# Patient Record
Sex: Female | Born: 1975 | Hispanic: No | Marital: Married | Smoking: Former smoker
Health system: Southern US, Community
[De-identification: ages and names within clinical notes are randomized; demographics above are authoritative.]

## PROBLEM LIST (undated history)

## (undated) DIAGNOSIS — F329 Major depressive disorder, single episode, unspecified: Secondary | ICD-10-CM

## (undated) DIAGNOSIS — N200 Calculus of kidney: Secondary | ICD-10-CM

## (undated) DIAGNOSIS — N83209 Unspecified ovarian cyst, unspecified side: Secondary | ICD-10-CM

## (undated) DIAGNOSIS — Z8489 Family history of other specified conditions: Secondary | ICD-10-CM

## (undated) DIAGNOSIS — N920 Excessive and frequent menstruation with regular cycle: Secondary | ICD-10-CM

## (undated) DIAGNOSIS — R5383 Other fatigue: Secondary | ICD-10-CM

## (undated) DIAGNOSIS — K649 Unspecified hemorrhoids: Secondary | ICD-10-CM

## (undated) DIAGNOSIS — F32A Depression, unspecified: Secondary | ICD-10-CM

## (undated) DIAGNOSIS — K589 Irritable bowel syndrome without diarrhea: Secondary | ICD-10-CM

## (undated) DIAGNOSIS — F419 Anxiety disorder, unspecified: Secondary | ICD-10-CM

## (undated) DIAGNOSIS — Z87442 Personal history of urinary calculi: Secondary | ICD-10-CM

## (undated) HISTORY — DX: Unspecified hemorrhoids: K64.9

## (undated) HISTORY — DX: Other fatigue: R53.83

## (undated) HISTORY — DX: Irritable bowel syndrome, unspecified: K58.9

## (undated) HISTORY — PX: RHINOPLASTY: SUR1284

## (undated) HISTORY — DX: Excessive and frequent menstruation with regular cycle: N92.0

## (undated) HISTORY — DX: Unspecified ovarian cyst, unspecified side: N83.209

## (undated) HISTORY — DX: Anxiety disorder, unspecified: F41.9

## (undated) HISTORY — PX: TUBAL LIGATION: SHX77

## (undated) HISTORY — PX: TONSILLECTOMY: SUR1361

## (undated) HISTORY — DX: Depression, unspecified: F32.A

## (undated) HISTORY — PX: ABDOMINAL HYSTERECTOMY: SHX81

## (undated) HISTORY — DX: Major depressive disorder, single episode, unspecified: F32.9

---
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
## 2007-06-11 ENCOUNTER — Emergency Department: Payer: Self-pay | Admitting: Emergency Medicine

## 2007-06-11 ENCOUNTER — Other Ambulatory Visit: Payer: Self-pay

## 2008-04-26 ENCOUNTER — Ambulatory Visit: Payer: Self-pay | Admitting: Gastroenterology

## 2008-06-09 ENCOUNTER — Emergency Department: Payer: Self-pay | Admitting: Emergency Medicine

## 2009-10-23 ENCOUNTER — Ambulatory Visit: Payer: Self-pay | Admitting: Gastroenterology

## 2010-08-28 ENCOUNTER — Emergency Department: Payer: Self-pay | Admitting: Emergency Medicine

## 2013-02-24 ENCOUNTER — Emergency Department: Payer: Self-pay | Admitting: Emergency Medicine

## 2013-05-05 ENCOUNTER — Ambulatory Visit: Payer: Self-pay | Admitting: Urology

## 2013-12-30 ENCOUNTER — Emergency Department: Payer: Self-pay | Admitting: Emergency Medicine

## 2013-12-30 LAB — PREGNANCY, URINE: PREGNANCY TEST, URINE: NEGATIVE m[IU]/mL

## 2015-03-06 LAB — LIPID PANEL
Cholesterol: 119 mg/dL (ref 0–200)
HDL: 40 mg/dL (ref 35–70)
LDL CALC: 69 mg/dL
Triglycerides: 50 mg/dL (ref 40–160)

## 2015-03-06 LAB — HM PAP SMEAR: HM Pap smear: NEGATIVE

## 2015-03-06 LAB — HEMOGLOBIN A1C: Hgb A1c MFr Bld: 5.4 % (ref 4.0–6.0)

## 2015-04-23 ENCOUNTER — Encounter: Payer: Self-pay | Admitting: *Deleted

## 2015-04-23 ENCOUNTER — Ambulatory Visit (INDEPENDENT_AMBULATORY_CARE_PROVIDER_SITE_OTHER): Payer: Medicaid Other | Admitting: *Deleted

## 2015-04-23 VITALS — BP 115/79 | HR 70 | Wt 217.2 lb

## 2015-04-23 DIAGNOSIS — E669 Obesity, unspecified: Secondary | ICD-10-CM | POA: Diagnosis not present

## 2015-04-23 MED ORDER — CYANOCOBALAMIN 1000 MCG/ML IJ SOLN
1000.0000 ug | Freq: Once | INTRAMUSCULAR | Status: AC
  Administered 2015-04-23: 1000 ug via INTRAMUSCULAR

## 2015-04-23 NOTE — Progress Notes (Signed)
Patient ID: Martha Freeman, female   DOB: 05/08/1976, 39 y.o.   MRN: [id number] PT IS HERE FOR HER MONTHLY B-12 INJ PT IS DOING WELL, DENIES ANY COMPLAINTS

## 2015-05-14 ENCOUNTER — Encounter: Payer: Self-pay | Admitting: *Deleted

## 2015-05-22 ENCOUNTER — Ambulatory Visit (INDEPENDENT_AMBULATORY_CARE_PROVIDER_SITE_OTHER): Payer: Medicaid Other | Admitting: *Deleted

## 2015-05-22 ENCOUNTER — Ambulatory Visit: Payer: Medicaid Other

## 2015-05-22 VITALS — BP 108/74 | HR 77 | Ht 66.6 in | Wt 221.0 lb

## 2015-05-22 DIAGNOSIS — G9331 Postviral fatigue syndrome: Secondary | ICD-10-CM

## 2015-05-22 DIAGNOSIS — G933 Postviral fatigue syndrome: Secondary | ICD-10-CM | POA: Diagnosis not present

## 2015-05-22 MED ORDER — CYANOCOBALAMIN 1000 MCG/ML IJ SOLN
1000.0000 ug | Freq: Once | INTRAMUSCULAR | Status: AC
  Administered 2015-05-22: 1000 ug via INTRAMUSCULAR

## 2015-05-22 NOTE — Progress Notes (Cosign Needed)
Pt is here for her monthly b-12 inj Denies any side effects

## 2015-09-28 ENCOUNTER — Ambulatory Visit: Payer: Medicaid Other

## 2015-09-28 ENCOUNTER — Ambulatory Visit (INDEPENDENT_AMBULATORY_CARE_PROVIDER_SITE_OTHER): Payer: Medicaid Other

## 2015-09-28 VITALS — BP 113/79 | HR 79 | Ht 66.6 in | Wt 225.2 lb

## 2015-09-28 DIAGNOSIS — G933 Postviral fatigue syndrome: Secondary | ICD-10-CM | POA: Diagnosis not present

## 2015-09-28 DIAGNOSIS — G9331 Postviral fatigue syndrome: Secondary | ICD-10-CM

## 2015-09-28 MED ORDER — CYANOCOBALAMIN 1000 MCG/ML IJ SOLN
1000.0000 ug | Freq: Once | INTRAMUSCULAR | Status: AC
  Administered 2015-09-28: 1000 ug via INTRAMUSCULAR

## 2015-09-28 NOTE — Progress Notes (Signed)
Patient ID: Martha Freeman, female   DOB: 1976-03-20, 39 y.o.   MRN: [id number] Pt presents for chronic fatigue syndrome and B-12 injection. Pt states her energy level down at present.  No other complaints.

## 2015-10-25 ENCOUNTER — Encounter: Payer: Self-pay | Admitting: Obstetrics and Gynecology

## 2015-10-25 ENCOUNTER — Ambulatory Visit (INDEPENDENT_AMBULATORY_CARE_PROVIDER_SITE_OTHER): Payer: Medicaid Other | Admitting: Obstetrics and Gynecology

## 2015-10-25 VITALS — BP 105/73 | HR 92 | Wt 225.9 lb

## 2015-10-25 DIAGNOSIS — E538 Deficiency of other specified B group vitamins: Secondary | ICD-10-CM | POA: Diagnosis not present

## 2015-10-25 DIAGNOSIS — E559 Vitamin D deficiency, unspecified: Secondary | ICD-10-CM

## 2015-10-25 DIAGNOSIS — Z8619 Personal history of other infectious and parasitic diseases: Secondary | ICD-10-CM

## 2015-10-25 DIAGNOSIS — E669 Obesity, unspecified: Secondary | ICD-10-CM

## 2015-10-25 MED ORDER — PHENTERMINE HCL 37.5 MG PO TABS
37.5000 mg | ORAL_TABLET | Freq: Every day | ORAL | Status: DC
Start: 2015-10-25 — End: 2016-01-17

## 2015-10-25 NOTE — Progress Notes (Signed)
Subjective:     Patient ID: Martha Freeman, female   DOB: [birth year], 39 y.o.   MRN: [id number]  HPI H/o ovarian cyst, last seen earlier this year and started on OCP for suppression for a few months, no pain at this time, but menses still irregular. Stopped OCPs due to weight gain.  Also on monthly B12 injections.  Review of Systems Fatigue relieved with B12 injections. Weight gain    Objective:   Physical Exam A&O x4 Well groomed female in no distress Pelvic exam: normal external genitalia, vulva, vagina, cervix, uterus and adnexa.    Assessment:     B12 deficiency Obesity Fatigue  h/o recurrent shingles    Plan:     Repeat labs Recommended shingles vaccine- rx given   Mikhaila Roh,CNM

## 2015-10-26 ENCOUNTER — Telehealth: Payer: Self-pay | Admitting: *Deleted

## 2015-10-26 LAB — CBC
HEMATOCRIT: 42.1 % (ref 34.0–46.6)
HEMOGLOBIN: 13.9 g/dL (ref 11.1–15.9)
MCH: 28.8 pg (ref 26.6–33.0)
MCHC: 33 g/dL (ref 31.5–35.7)
MCV: 87 fL (ref 79–97)
Platelets: 357 10*3/uL (ref 150–379)
RBC: 4.83 x10E6/uL (ref 3.77–5.28)
RDW: 12.9 % (ref 12.3–15.4)
WBC: 10.4 10*3/uL (ref 3.4–10.8)

## 2015-10-26 LAB — VITAMIN B12: VITAMIN B 12: 483 pg/mL (ref 211–946)

## 2015-10-26 LAB — VITAMIN D 25 HYDROXY (VIT D DEFICIENCY, FRACTURES): Vit D, 25-Hydroxy: 37.6 ng/mL (ref 30.0–100.0)

## 2015-10-26 LAB — IRON: Iron: 60 ug/dL (ref 27–159)

## 2015-10-26 NOTE — Telephone Encounter (Signed)
Notified pt of results 

## 2015-10-26 NOTE — Telephone Encounter (Signed)
-----   Message from Joylene Igo, [place] sent at 10/26/2015 11:50 AM EST ----- Please let her know all labs were normal

## 2015-11-22 ENCOUNTER — Ambulatory Visit (INDEPENDENT_AMBULATORY_CARE_PROVIDER_SITE_OTHER): Payer: Medicaid Other | Admitting: Obstetrics and Gynecology

## 2015-11-22 VITALS — BP 110/73 | HR 98 | Ht 66.6 in | Wt 214.1 lb

## 2015-11-22 DIAGNOSIS — E669 Obesity, unspecified: Secondary | ICD-10-CM | POA: Diagnosis not present

## 2015-11-22 MED ORDER — CYANOCOBALAMIN 1000 MCG/ML IJ SOLN
1000.0000 ug | Freq: Once | INTRAMUSCULAR | Status: AC
  Administered 2015-11-22: 1000 ug via INTRAMUSCULAR

## 2015-11-22 NOTE — Progress Notes (Cosign Needed)
Patient ID: Martha Freeman, female   DOB: 1976-03-14, 40 y.o.   MRN: [id number] Pt presents for weight, B/P, B-12 injection. No side effects of medication-Phentermine, or B-12.  Weight loss of 11 lbs. Encouraged eating healthy and exercise.

## 2015-12-20 ENCOUNTER — Ambulatory Visit (INDEPENDENT_AMBULATORY_CARE_PROVIDER_SITE_OTHER): Payer: Medicaid Other | Admitting: Obstetrics and Gynecology

## 2015-12-20 VITALS — BP 118/79 | HR 82 | Wt 208.0 lb

## 2015-12-20 DIAGNOSIS — E669 Obesity, unspecified: Secondary | ICD-10-CM

## 2015-12-20 DIAGNOSIS — E538 Deficiency of other specified B group vitamins: Secondary | ICD-10-CM

## 2015-12-20 MED ORDER — CYANOCOBALAMIN 1000 MCG/ML IJ SOLN
1000.0000 ug | Freq: Once | INTRAMUSCULAR | Status: AC
  Administered 2015-12-20: 1000 ug via INTRAMUSCULAR

## 2015-12-20 NOTE — Progress Notes (Cosign Needed)
Patient ID: Martha Freeman, female   DOB: Feb 08, 1976, 40 y.o.   MRN: [id number] Pt presents for weight, B/P, B-12 injection. No side effects of medication-Phentermine, or B-12.  Weight loss of 6 lbs. Encouraged eating healthy and exercise.

## 2016-01-17 ENCOUNTER — Ambulatory Visit (INDEPENDENT_AMBULATORY_CARE_PROVIDER_SITE_OTHER): Payer: Medicaid Other | Admitting: Obstetrics and Gynecology

## 2016-01-17 ENCOUNTER — Encounter: Payer: Self-pay | Admitting: Obstetrics and Gynecology

## 2016-01-17 VITALS — BP 129/80 | HR 125 | Ht 67.0 in | Wt 207.6 lb

## 2016-01-17 DIAGNOSIS — E669 Obesity, unspecified: Secondary | ICD-10-CM | POA: Diagnosis not present

## 2016-01-17 MED ORDER — CYANOCOBALAMIN 1000 MCG/ML IJ SOLN: 1000.0000 ug | Freq: Once | INTRAMUSCULAR | Status: DC

## 2016-01-17 MED ORDER — PHENTERMINE HCL 37.5 MG PO TABS: 37.5000 mg | ORAL_TABLET | Freq: Every day | ORAL | Status: DC

## 2016-01-17 NOTE — Progress Notes (Signed)
SUBJECTIVE:  40 y.o. here for follow-up weight loss visit, previously seen 4 weeks ago. Denies any concerns and feels like medication has worked well, has 1 week left of pills, desires to continue to lose the rest of weight needed to get BMI <27. Has lost 18# to date.  OBJECTIVE:  BP 129/80 mmHg  Pulse 125  Ht 5\' 7"  (1.702 m)  Wt 207 lb 9.6 oz (94.167 kg)  BMI 32.51 kg/m2  LMP 12/23/2015 (Exact Date)  Body mass index is 32.51 kg/(m^2). Patient appears well. ASSESSMENT:  Obesity- responding well to weight loss plan PLAN:  To continue with current medications after taking 10 days off phentermine. Rx given. B12 1034mcg/ml injection given RTC in 5 weeks as planned  Melody [place], CNM

## 2016-02-21 ENCOUNTER — Ambulatory Visit: Payer: Medicaid Other

## 2016-02-27 ENCOUNTER — Ambulatory Visit (INDEPENDENT_AMBULATORY_CARE_PROVIDER_SITE_OTHER): Payer: Medicaid Other | Admitting: Obstetrics and Gynecology

## 2016-02-27 VITALS — BP 113/85 | HR 99 | Wt 194.1 lb

## 2016-02-27 DIAGNOSIS — E669 Obesity, unspecified: Secondary | ICD-10-CM | POA: Diagnosis not present

## 2016-02-27 MED ORDER — CYANOCOBALAMIN 1000 MCG/ML IJ SOLN
1000.0000 ug | Freq: Once | INTRAMUSCULAR | Status: AC
  Administered 2016-02-27: 1000 ug via INTRAMUSCULAR

## 2016-02-27 NOTE — Progress Notes (Signed)
Patient ID: Martha Freeman, female   DOB: 03-Mar-1976, 40 y.o.   MRN: [id number] Pt presents for weight, B/P, B-12 injection. No side effects of medication-Phentermine, or B-12.  Weight loss of  13 lbs. Encouraged eating healthy and exercise.

## 2016-03-26 ENCOUNTER — Ambulatory Visit (INDEPENDENT_AMBULATORY_CARE_PROVIDER_SITE_OTHER): Payer: Medicaid Other | Admitting: Obstetrics and Gynecology

## 2016-03-26 VITALS — BP 110/75 | HR 114 | Wt 191.5 lb

## 2016-03-26 DIAGNOSIS — E669 Obesity, unspecified: Secondary | ICD-10-CM

## 2016-03-26 MED ORDER — CYANOCOBALAMIN 1000 MCG/ML IJ SOLN
1000.0000 ug | Freq: Once | INTRAMUSCULAR | Status: AC
  Administered 2016-03-26: 1000 ug via INTRAMUSCULAR

## 2016-03-26 NOTE — Progress Notes (Signed)
Patient ID: Martha Freeman, female   DOB: 01/27/1976, 40 y.o.   MRN: [id number] Pt presents for weight, B/P, B-12 injection. No side effects of medication-Phentermine, or B-12.  Weight loss of 3 lbs. Encouraged eating healthy and exercise.

## 2016-04-23 ENCOUNTER — Ambulatory Visit (INDEPENDENT_AMBULATORY_CARE_PROVIDER_SITE_OTHER): Payer: Medicaid Other | Admitting: Obstetrics and Gynecology

## 2016-04-23 ENCOUNTER — Encounter: Payer: Self-pay | Admitting: Obstetrics and Gynecology

## 2016-04-23 VITALS — BP 121/89 | HR 96 | Wt 191.5 lb

## 2016-04-23 DIAGNOSIS — E669 Obesity, unspecified: Secondary | ICD-10-CM

## 2016-04-23 DIAGNOSIS — Z79899 Other long term (current) drug therapy: Secondary | ICD-10-CM

## 2016-04-23 DIAGNOSIS — M25562 Pain in left knee: Secondary | ICD-10-CM

## 2016-04-23 MED ORDER — CYANOCOBALAMIN 1000 MCG/ML IJ SOLN
1000.0000 ug | Freq: Once | INTRAMUSCULAR | Status: AC
  Administered 2016-04-23: 1000 ug via INTRAMUSCULAR

## 2016-04-23 MED ORDER — PHENTERMINE HCL 37.5 MG PO TABS: 37.5000 mg | ORAL_TABLET | Freq: Every day | ORAL | Status: DC

## 2016-04-23 MED ORDER — CYANOCOBALAMIN 1000 MCG/ML IJ SOLN: 1000.0000 ug | Freq: Once | INTRAMUSCULAR | Status: DC

## 2016-04-23 NOTE — Progress Notes (Signed)
SUBJECTIVE:  40 y.o. here for follow-up weight loss visit, previously seen 4 weeks ago. Denies any concerns and feels like medication has worked well, and has continued to lose weight.   OBJECTIVE:  BP 121/89 mmHg  Pulse 96  Wt 191 lb 8 oz (86.864 kg)  LMP 04/17/2016  Body mass index is 29.99 kg/(m^2). Patient appears well. A&O x4  well groomed female in no distress  Left Knee Exam  Swelling: Mild Effusion: Yes  Tenderness  None  Range of Motion  Normal left knee ROM Extension: Normal Flexion:     Normal  Tests  Varus:  Negative Valgus: Negative  Comments:  Crepitus noted with extension     ASSESSMENT:  Obesity- responding well to weight loss plan Left knee pain PLAN:  To continue with current medications. B12 1023mcg/ml injection given Referral to orthopedics for evaluation and treatment of left knee. RTC in 4 weeks as planned  Melody [place], CNM

## 2016-05-13 ENCOUNTER — Ambulatory Visit: Payer: Medicaid Other

## 2016-05-21 ENCOUNTER — Ambulatory Visit (INDEPENDENT_AMBULATORY_CARE_PROVIDER_SITE_OTHER): Payer: Medicaid Other | Admitting: Obstetrics and Gynecology

## 2016-05-21 VITALS — BP 110/72 | HR 80 | Ht 67.0 in | Wt 190.3 lb

## 2016-05-21 DIAGNOSIS — E669 Obesity, unspecified: Secondary | ICD-10-CM

## 2016-05-21 DIAGNOSIS — M25562 Pain in left knee: Secondary | ICD-10-CM | POA: Diagnosis not present

## 2016-05-21 MED ORDER — CYANOCOBALAMIN 1000 MCG/ML IJ SOLN
1000.0000 ug | Freq: Once | INTRAMUSCULAR | Status: AC
  Administered 2016-05-21: 1000 ug via INTRAMUSCULAR

## 2016-05-21 NOTE — Progress Notes (Signed)
Patient ID: Martha Freeman, female   DOB: [birth year], 40 y.o.   MRN: [id number]   Pt presents for wt, bp and b12 inj. Weight is down 1# since last visit. Pt has not been able to work out lately do to a knee injury. Side effect is dry mouth. She increases her h20 which seems to help. F/u in 4 weeks.

## 2016-06-05 ENCOUNTER — Ambulatory Visit: Payer: Medicaid Other | Admitting: Physical Therapy

## 2016-06-18 ENCOUNTER — Ambulatory Visit (INDEPENDENT_AMBULATORY_CARE_PROVIDER_SITE_OTHER): Payer: Medicaid Other | Admitting: Obstetrics and Gynecology

## 2016-06-18 VITALS — BP 118/93 | HR 104 | Wt 190.0 lb

## 2016-06-18 DIAGNOSIS — E669 Obesity, unspecified: Secondary | ICD-10-CM | POA: Diagnosis not present

## 2016-06-18 MED ORDER — CYANOCOBALAMIN 1000 MCG/ML IJ SOLN
1000.0000 ug | Freq: Once | INTRAMUSCULAR | Status: AC
  Administered 2016-06-18: 1000 ug via INTRAMUSCULAR

## 2016-06-18 NOTE — Progress Notes (Signed)
Patient ID: Martha Freeman, female   DOB: [birth year], 40 y.o.   MRN: [id number] Pt presents for weight, B/P, B-12 injection. No side effects of medication-Phentermine, or B-12.  Weight remains same at 190 lbs. Encouraged eating healthy and exercise. Was on vacation.

## 2016-07-16 ENCOUNTER — Ambulatory Visit: Payer: Medicaid Other

## 2016-07-17 ENCOUNTER — Ambulatory Visit (INDEPENDENT_AMBULATORY_CARE_PROVIDER_SITE_OTHER): Payer: Medicaid Other | Admitting: Obstetrics and Gynecology

## 2016-07-17 ENCOUNTER — Ambulatory Visit: Payer: Medicaid Other

## 2016-07-17 VITALS — BP 110/88 | HR 118 | Wt 191.2 lb

## 2016-07-17 DIAGNOSIS — E669 Obesity, unspecified: Secondary | ICD-10-CM | POA: Diagnosis not present

## 2016-07-17 MED ORDER — CYANOCOBALAMIN 1000 MCG/ML IJ SOLN
1000.0000 ug | Freq: Once | INTRAMUSCULAR | Status: AC
  Administered 2016-07-17: 1000 ug via INTRAMUSCULAR

## 2016-07-17 NOTE — Progress Notes (Signed)
Patient ID: Martha Freeman, female   DOB: [birth year], 40 y.o.   MRN: [id number] Pt presents for weight, B/P, B-12 injection. No side effects of medication-Phentermine, or B-12.  Weight gain of 1 lbs. Encouraged eating healthy and exercise. Pt may have wife give her B-12 injections per MNS.

## 2016-09-26 ENCOUNTER — Ambulatory Visit (INDEPENDENT_AMBULATORY_CARE_PROVIDER_SITE_OTHER): Payer: Medicaid Other | Admitting: Obstetrics and Gynecology

## 2016-09-26 VITALS — BP 112/78 | HR 70 | Ht 67.0 in | Wt 192.0 lb

## 2016-09-26 DIAGNOSIS — E669 Obesity, unspecified: Secondary | ICD-10-CM | POA: Diagnosis not present

## 2016-09-26 MED ORDER — CYANOCOBALAMIN 1000 MCG/ML IJ SOLN
1000.0000 ug | Freq: Once | INTRAMUSCULAR | Status: AC
  Administered 2016-09-26: 1000 ug via INTRAMUSCULAR

## 2016-09-26 MED ORDER — PHENTERMINE HCL 37.5 MG PO TABS: 37.5000 mg | ORAL_TABLET | Freq: Every day | ORAL | 0 refills | Status: DC

## 2016-09-26 NOTE — Progress Notes (Signed)
Patient ID: Martha Freeman, female   DOB: 01-Apr-1976, 40 y.o.   MRN: [id number]  Pt presents for weight, B/P, B-12 injection. No side effects of medication-B-12.  Weight gain of _1_ lbs. Encouraged eating healthy and exercise. Has not taken phentermine since September. She was not sure whether or not she needed to continue but would like to restart.  With [facility] approval will call when Rx is ready, [facility] at hospital.  She would like to know if her husband can give her the B-12 injections.

## 2016-10-31 ENCOUNTER — Encounter: Payer: Medicaid Other | Admitting: Obstetrics and Gynecology

## 2016-11-20 ENCOUNTER — Encounter: Payer: Medicaid Other | Admitting: Obstetrics and Gynecology

## 2016-11-21 ENCOUNTER — Encounter: Payer: Self-pay | Admitting: Obstetrics and Gynecology

## 2016-11-21 ENCOUNTER — Ambulatory Visit (INDEPENDENT_AMBULATORY_CARE_PROVIDER_SITE_OTHER): Payer: Medicaid Other | Admitting: Obstetrics and Gynecology

## 2016-11-21 VITALS — BP 123/95 | HR 110 | Ht 67.0 in | Wt 189.5 lb

## 2016-11-21 DIAGNOSIS — E663 Overweight: Secondary | ICD-10-CM | POA: Diagnosis not present

## 2016-11-21 DIAGNOSIS — Z79899 Other long term (current) drug therapy: Secondary | ICD-10-CM | POA: Diagnosis not present

## 2016-11-21 DIAGNOSIS — E559 Vitamin D deficiency, unspecified: Secondary | ICD-10-CM | POA: Diagnosis not present

## 2016-11-21 MED ORDER — CYANOCOBALAMIN 1000 MCG/ML IJ SOLN: 1000.0000 ug | Freq: Once | INTRAMUSCULAR | 2 refills | Status: DC

## 2016-11-21 MED ORDER — PHENTERMINE HCL 37.5 MG PO TABS: 37.5000 mg | ORAL_TABLET | Freq: Every day | ORAL | 0 refills | Status: DC

## 2016-11-21 MED ORDER — CYANOCOBALAMIN 1000 MCG/ML IJ SOLN
1000.0000 ug | Freq: Once | INTRAMUSCULAR | Status: AC
  Administered 2016-11-21: 1000 ug via INTRAMUSCULAR

## 2016-11-21 NOTE — Progress Notes (Signed)
SUBJECTIVE:  41 y.o. here for follow-up weight loss visit, previously seen 4 weeks ago. Denies any concerns and feels like medication is continuing to work well. Has been off meds for a month and only complaint is tiredness.  OBJECTIVE:  BP (!) 123/95   Pulse (!) 110   Ht 5\' 7"  (1.702 m)   Wt 189 lb 8 oz (86 kg)   LMP 10/26/2016 (Exact Date)   BMI 29.68 kg/m   Body mass index is 29.68 kg/m. Patient appears well.   ASSESSMENT:  Overweight- responding well to weight loss plan H/o vitamin d deficiency PLAN:  To continue with weight loss medications. B12 1011mcg/ml injection  Labs repeated  RTC in 12 weeks as planned, as wife will give injection.  Melody [place], CNM

## 2016-11-22 LAB — COMPREHENSIVE METABOLIC PANEL
ALBUMIN: 4.8 g/dL (ref 3.5–5.5)
ALK PHOS: 63 IU/L (ref 39–117)
ALT: 10 IU/L (ref 0–32)
AST: 14 IU/L (ref 0–40)
Albumin/Globulin Ratio: 1.7 (ref 1.2–2.2)
BILIRUBIN TOTAL: 0.3 mg/dL (ref 0.0–1.2)
BUN / CREAT RATIO: 7 — AB (ref 9–23)
BUN: 6 mg/dL (ref 6–20)
CHLORIDE: 103 mmol/L (ref 96–106)
CO2: 20 mmol/L (ref 18–29)
CREATININE: 0.85 mg/dL (ref 0.57–1.00)
Calcium: 9.9 mg/dL (ref 8.7–10.2)
GFR calc Af Amer: 101 mL/min/{1.73_m2} (ref >59)
GFR calc non Af Amer: 87 mL/min/{1.73_m2} (ref >59)
GLOBULIN, TOTAL: 2.9 g/dL (ref 1.5–4.5)
GLUCOSE: 111 mg/dL — AB (ref 65–99)
Potassium: 4.3 mmol/L (ref 3.5–5.2)
SODIUM: 142 mmol/L (ref 134–144)
Total Protein: 7.7 g/dL (ref 6.0–8.5)

## 2016-11-22 LAB — LIPID PANEL
Chol/HDL Ratio: 3.6 ratio units (ref 0.0–4.4)
Cholesterol, Total: 135 mg/dL (ref 100–199)
HDL: 37 mg/dL — AB (ref >39)
LDL Calculated: 86 mg/dL (ref 0–99)
Triglycerides: 60 mg/dL (ref 0–149)
VLDL CHOLESTEROL CAL: 12 mg/dL (ref 5–40)

## 2016-11-22 LAB — HEMOGLOBIN A1C
ESTIMATED AVERAGE GLUCOSE: 103 mg/dL
HEMOGLOBIN A1C: 5.2 % (ref 4.8–5.6)

## 2016-11-22 LAB — VITAMIN D 25 HYDROXY (VIT D DEFICIENCY, FRACTURES): Vit D, 25-Hydroxy: 46.1 ng/mL (ref 30.0–100.0)

## 2016-11-22 LAB — PLEASE NOTE

## 2016-12-21 ENCOUNTER — Encounter: Payer: Self-pay | Admitting: Obstetrics and Gynecology

## 2016-12-21 ENCOUNTER — Other Ambulatory Visit: Payer: Self-pay | Admitting: Obstetrics and Gynecology

## 2016-12-24 ENCOUNTER — Other Ambulatory Visit: Payer: Self-pay | Admitting: Obstetrics and Gynecology

## 2016-12-24 ENCOUNTER — Telehealth: Payer: Self-pay | Admitting: Obstetrics and Gynecology

## 2016-12-24 NOTE — Telephone Encounter (Signed)
PT CALLED AND SHE NEEDS A REFILL ON THE PHENTERMINE, SHE HAD HER BP CHECKED TODAY 12/24/16 AND IT WAS 117/72, THE NURSE AT THE SCHOOL DID IT, SHE STATED SHE SENT IN A MYCHART MESSAGE FOR HER WEIGHT AND BP LAST Friday.

## 2016-12-25 ENCOUNTER — Other Ambulatory Visit: Payer: Self-pay | Admitting: *Deleted

## 2016-12-25 ENCOUNTER — Other Ambulatory Visit: Payer: Self-pay | Admitting: Obstetrics and Gynecology

## 2016-12-25 MED ORDER — PHENTERMINE HCL 37.5 MG PO TABS: 37.5000 mg | ORAL_TABLET | Freq: Every day | ORAL | 2 refills | Status: DC

## 2016-12-25 NOTE — Telephone Encounter (Signed)
Notified pt of results 

## 2016-12-25 NOTE — Telephone Encounter (Signed)
She has two refills at pharmacy

## 2017-03-26 ENCOUNTER — Encounter: Payer: Medicaid Other | Admitting: Obstetrics and Gynecology

## 2017-05-28 ENCOUNTER — Other Ambulatory Visit: Payer: Self-pay | Admitting: Obstetrics and Gynecology

## 2017-05-28 ENCOUNTER — Ambulatory Visit (INDEPENDENT_AMBULATORY_CARE_PROVIDER_SITE_OTHER): Payer: Medicaid Other | Admitting: Obstetrics and Gynecology

## 2017-05-28 ENCOUNTER — Encounter: Payer: Self-pay | Admitting: Obstetrics and Gynecology

## 2017-05-28 VITALS — BP 128/97 | HR 90 | Ht 67.0 in | Wt 202.4 lb

## 2017-05-28 DIAGNOSIS — K644 Residual hemorrhoidal skin tags: Secondary | ICD-10-CM

## 2017-05-28 DIAGNOSIS — R5383 Other fatigue: Secondary | ICD-10-CM

## 2017-05-28 DIAGNOSIS — E669 Obesity, unspecified: Secondary | ICD-10-CM | POA: Diagnosis not present

## 2017-05-28 DIAGNOSIS — Z01419 Encounter for gynecological examination (general) (routine) without abnormal findings: Secondary | ICD-10-CM

## 2017-05-28 MED ORDER — PHENTERMINE HCL 37.5 MG PO TABS: 37.5000 mg | ORAL_TABLET | Freq: Every day | ORAL | 2 refills | Status: DC

## 2017-05-28 MED ORDER — HYDROCORTISONE 2.5 % RE CREA: 1.0000 "application " | TOPICAL_CREAM | Freq: Two times a day (BID) | RECTAL | 2 refills | Status: DC

## 2017-05-28 MED ORDER — FESOTERODINE FUMARATE ER 8 MG PO TB24
8.0000 mg | ORAL_TABLET | Freq: Every day | ORAL | 2 refills | Status: DC
Start: 2017-05-28 — End: 2019-07-22

## 2017-05-28 MED ORDER — CYANOCOBALAMIN 1000 MCG/ML IJ SOLN
1000.0000 ug | Freq: Once | INTRAMUSCULAR | Status: AC
  Administered 2017-05-28: 1000 ug via INTRAMUSCULAR

## 2017-05-28 NOTE — Patient Instructions (Signed)
Preventive Care 18-39 Years, Female Preventive care refers to lifestyle choices and visits with your health care provider that can promote health and wellness. What does preventive care include?  A yearly physical exam. This is also called an annual well check.  Dental exams once or twice a year.  Routine eye exams. Ask your health care provider how often you should have your eyes checked.  Personal lifestyle choices, including: ? Daily care of your teeth and gums. ? Regular physical activity. ? Eating a healthy diet. ? Avoiding tobacco and drug use. ? Limiting alcohol use. ? Practicing safe sex. ? Taking vitamin and mineral supplements as recommended by your health care provider. What happens during an annual well check? The services and screenings done by your health care provider during your annual well check will depend on your age, overall health, lifestyle risk factors, and family history of disease. Counseling Your health care provider may ask you questions about your:  Alcohol use.  Tobacco use.  Drug use.  Emotional well-being.  Home and relationship well-being.  Sexual activity.  Eating habits.  Work and work Statistician.  Method of birth control.  Menstrual cycle.  Pregnancy history.  Screening You may have the following tests or measurements:  Height, weight, and BMI.  Diabetes screening. This is done by checking your blood sugar (glucose) after you have not eaten for a while (fasting).  Blood pressure.  Lipid and cholesterol levels. These may be checked every 5 years starting at age 38.  Skin check.  Hepatitis C blood test.  Hepatitis B blood test.  Sexually transmitted disease (STD) testing.  BRCA-related cancer screening. This may be done if you have a family history of breast, ovarian, tubal, or peritoneal cancers.  Pelvic exam and Pap test. This may be done every 3 years starting at age 38. Starting at age 30, this may be done  every 5 years if you have a Pap test in combination with an HPV test.  Discuss your test results, treatment options, and if necessary, the need for more tests with your health care provider. Vaccines Your health care provider may recommend certain vaccines, such as:  Influenza vaccine. This is recommended every year.  Tetanus, diphtheria, and acellular pertussis (Tdap, Td) vaccine. You may need a Td booster every 10 years.  Varicella vaccine. You may need this if you have not been vaccinated.  HPV vaccine. If you are 39 or younger, you may need three doses over 6 months.  Measles, mumps, and rubella (MMR) vaccine. You may need at least one dose of MMR. You may also need a second dose.  Pneumococcal 13-valent conjugate (PCV13) vaccine. You may need this if you have certain conditions and were not previously vaccinated.  Pneumococcal polysaccharide (PPSV23) vaccine. You may need one or two doses if you smoke cigarettes or if you have certain conditions.  Meningococcal vaccine. One dose is recommended if you are age 68-21 years and a first-year college student living in a residence hall, or if you have one of several medical conditions. You may also need additional booster doses.  Hepatitis A vaccine. You may need this if you have certain conditions or if you travel or work in places where you may be exposed to hepatitis A.  Hepatitis B vaccine. You may need this if you have certain conditions or if you travel or work in places where you may be exposed to hepatitis B.  Haemophilus influenzae type b (Hib) vaccine. You may need this  if you have certain risk factors.  Talk to your health care provider about which screenings and vaccines you need and how often you need them. This information is not intended to replace advice given to you by your health care provider. Make sure you discuss any questions you have with your health care provider. Document Released: 12/30/2001 Document Revised:  07/23/2016 Document Reviewed: 09/04/2015 Elsevier Interactive Patient Education  2017 Elsevier Inc.  

## 2017-05-28 NOTE — Progress Notes (Signed)
Subjective:   Martha Freeman is a 41 y.o. No obstetric history on file. Caucasian female here for a routine well-woman exam.  [name] was 05/21/2017.    Current complaints: stressed due to new home, increased finiacial strain, PCP: me       does desire labs  Social History: Sexual: homosexual Marital Status: single Living situation: with partner / significant other Occupation: works in school system Tobacco/alcohol: no tobacco use Illicit drugs: no history of illicit drug use  The following portions of the patient's history were reviewed and updated as appropriate: allergies, current medications, past family history, past medical history, past social history, past surgical history and problem list.  Past Medical History Past Medical History:  Diagnosis Date  . Anxiety   . Depression   . Fatigue   . Heavy periods     Past Surgical History Past Surgical History:  Procedure Laterality Date  . RHINOPLASTY    . TONSILLECTOMY    . TUBAL LIGATION      Gynecologic History No obstetric history on file.  [name] was 05/21/2017. Contraception: none Last Pap: 2016. Results were: negative with HPV+   Obstetric History OB History  No data available    Current Medications Current Outpatient Prescriptions on File Prior to Visit  Medication Sig Dispense Refill  . fesoterodine (TOVIAZ) 8 MG TB24 tablet Take 8 mg by mouth daily.    . phentermine (ADIPEX-P) 37.5 MG tablet Take 1 tablet (37.5 mg total) by mouth daily before breakfast. (Patient not taking: Reported on 05/28/2017) 30 tablet 2   No current facility-administered medications on file prior to visit.     Review of Systems Patient denies any headaches, blurred vision, shortness of breath, chest pain, abdominal pain, problems with bowel movements, urination, or intercourse.  Objective:  BP (!) 128/97 (BP Location: Left Arm, Patient Position: Sitting, Cuff Size: Normal)    Pulse 90   Ht 5\' 7"  (1.702 m)   Wt 202 lb 6.4 oz (91.8 kg)   LMP 05/21/2017   BMI 31.70 kg/m  Physical Exam  General:  Well developed, well nourished, no acute distress. She is alert and oriented x3. Skin:  Warm and dry Neck:  Midline trachea, no thyromegaly or nodules Cardiovascular: Regular rate and rhythm, no murmur heard Lungs:  Effort normal, all lung fields clear to auscultation bilaterally Breasts:  No dominant palpable mass, retraction, or nipple discharge Abdomen:  Soft, non tender, no hepatosplenomegaly or masses Pelvic:  External genitalia is normal in appearance.  The vagina is normal in appearance. The cervix is bulbous, no CMT.  Thin prep pap is done with HR HPV cotesting. Uterus is felt to be normal size, shape, and contour.  No adnexal masses or tenderness noted. Rectal: multiple small external hemorrhoids noted Extremities:  No swelling or varicosities noted Psych:  She has a normal mood and affect  Assessment:   Healthy well-woman exam Obesity External hemorrhoids   Plan:  encouraged to restart weight loss plan F/U 1 year for AE, or sooner if needed   [place] S[place], CNM

## 2017-05-29 LAB — LIPID PANEL
CHOL/HDL RATIO: 3.3 ratio (ref 0.0–4.4)
Cholesterol, Total: 152 mg/dL (ref 100–199)
HDL: 46 mg/dL (ref >39)
LDL Calculated: 93 mg/dL (ref 0–99)
Triglycerides: 63 mg/dL (ref 0–149)
VLDL CHOLESTEROL CAL: 13 mg/dL (ref 5–40)

## 2017-05-29 LAB — COMPREHENSIVE METABOLIC PANEL
ALBUMIN: 4.8 g/dL (ref 3.5–5.5)
ALK PHOS: 73 IU/L (ref 39–117)
ALT: 17 IU/L (ref 0–32)
AST: 15 IU/L (ref 0–40)
Albumin/Globulin Ratio: 1.7 (ref 1.2–2.2)
BUN / CREAT RATIO: 10 (ref 9–23)
BUN: 9 mg/dL (ref 6–20)
Bilirubin Total: 0.3 mg/dL (ref 0.0–1.2)
CO2: 23 mmol/L (ref 20–29)
CREATININE: 0.91 mg/dL (ref 0.57–1.00)
Calcium: 9.5 mg/dL (ref 8.7–10.2)
Chloride: 99 mmol/L (ref 96–106)
GFR calc Af Amer: 93 mL/min/{1.73_m2} (ref >59)
GFR calc non Af Amer: 80 mL/min/{1.73_m2} (ref >59)
GLUCOSE: 75 mg/dL (ref 65–99)
Globulin, Total: 2.8 g/dL (ref 1.5–4.5)
Potassium: 4.5 mmol/L (ref 3.5–5.2)
Sodium: 140 mmol/L (ref 134–144)
Total Protein: 7.6 g/dL (ref 6.0–8.5)

## 2017-05-29 LAB — HEMOGLOBIN A1C
Est. average glucose Bld gHb Est-mCnc: 103 mg/dL
HEMOGLOBIN A1C: 5.2 % (ref 4.8–5.6)

## 2017-05-29 LAB — TSH: TSH: 2.6 u[IU]/mL (ref 0.450–4.500)

## 2017-06-02 LAB — CYTOLOGY - PAP

## 2017-07-02 ENCOUNTER — Encounter: Payer: Self-pay | Admitting: Obstetrics and Gynecology

## 2017-07-03 ENCOUNTER — Telehealth: Payer: Self-pay | Admitting: Obstetrics and Gynecology

## 2017-07-03 NOTE — Telephone Encounter (Signed)
Patient called requesting a refill on phentermine.Thanks °

## 2017-07-03 NOTE — Telephone Encounter (Signed)
pls advise

## 2017-07-07 ENCOUNTER — Other Ambulatory Visit: Payer: Self-pay | Admitting: Obstetrics and Gynecology

## 2017-07-07 MED ORDER — PHENTERMINE HCL 37.5 MG PO TABS: 37.5000 mg | ORAL_TABLET | Freq: Every day | ORAL | 2 refills | Status: DC

## 2017-07-07 NOTE — Telephone Encounter (Signed)
Faxed to pharmacy

## 2017-07-07 NOTE — Telephone Encounter (Signed)
Done, please continue to send monthly weights

## 2018-02-28 ENCOUNTER — Other Ambulatory Visit: Payer: Self-pay | Admitting: Obstetrics and Gynecology

## 2018-04-09 ENCOUNTER — Encounter: Payer: Self-pay | Admitting: Obstetrics and Gynecology

## 2018-06-03 ENCOUNTER — Encounter: Payer: Self-pay | Admitting: Obstetrics and Gynecology

## 2018-06-03 ENCOUNTER — Ambulatory Visit (INDEPENDENT_AMBULATORY_CARE_PROVIDER_SITE_OTHER): Payer: Managed Care, Other (non HMO) | Admitting: Obstetrics and Gynecology

## 2018-06-03 VITALS — BP 126/86 | HR 97 | Ht 67.0 in | Wt 213.7 lb

## 2018-06-03 DIAGNOSIS — Z309 Encounter for contraceptive management, unspecified: Secondary | ICD-10-CM | POA: Diagnosis not present

## 2018-06-03 DIAGNOSIS — Z01419 Encounter for gynecological examination (general) (routine) without abnormal findings: Secondary | ICD-10-CM

## 2018-06-03 NOTE — Patient Instructions (Addendum)
Preventive Care 18-39 Years, Female Preventive care refers to lifestyle choices and visits with your health care provider that can promote health and wellness. What does preventive care include?  A yearly physical exam. This is also called an annual well check.  Dental exams once or twice a year.  Routine eye exams. Ask your health care provider how often you should have your eyes checked.  Personal lifestyle choices, including: ? Daily care of your teeth and gums. ? Regular physical activity. ? Eating a healthy diet. ? Avoiding tobacco and drug use. ? Limiting alcohol use. ? Practicing safe sex. ? Taking vitamin and mineral supplements as recommended by your health care provider. What happens during an annual well check? The services and screenings done by your health care provider during your annual well check will depend on your age, overall health, lifestyle risk factors, and family history of disease. Counseling Your health care provider may ask you questions about your:  Alcohol use.  Tobacco use.  Drug use.  Emotional well-being.  Home and relationship well-being.  Sexual activity.  Eating habits.  Work and work Statistician.  Method of birth control.  Menstrual cycle.  Pregnancy history.  Screening You may have the following tests or measurements:  Height, weight, and BMI.  Diabetes screening. This is done by checking your blood sugar (glucose) after you have not eaten for a while (fasting).  Blood pressure.  Lipid and cholesterol levels. These may be checked every 5 years starting at age 38.  Skin check.  Hepatitis C blood test.  Hepatitis B blood test.  Sexually transmitted disease (STD) testing.  BRCA-related cancer screening. This may be done if you have a family history of breast, ovarian, tubal, or peritoneal cancers.  Pelvic exam and Pap test. This may be done every 3 years starting at age 38. Starting at age 30, this may be done  every 5 years if you have a Pap test in combination with an HPV test.  Discuss your test results, treatment options, and if necessary, the need for more tests with your health care provider. Vaccines Your health care provider may recommend certain vaccines, such as:  Influenza vaccine. This is recommended every year.  Tetanus, diphtheria, and acellular pertussis (Tdap, Td) vaccine. You may need a Td booster every 10 years.  Varicella vaccine. You may need this if you have not been vaccinated.  HPV vaccine. If you are 39 or younger, you may need three doses over 6 months.  Measles, mumps, and rubella (MMR) vaccine. You may need at least one dose of MMR. You may also need a second dose.  Pneumococcal 13-valent conjugate (PCV13) vaccine. You may need this if you have certain conditions and were not previously vaccinated.  Pneumococcal polysaccharide (PPSV23) vaccine. You may need one or two doses if you smoke cigarettes or if you have certain conditions.  Meningococcal vaccine. One dose is recommended if you are age 68-21 years and a first-year college student living in a residence hall, or if you have one of several medical conditions. You may also need additional booster doses.  Hepatitis A vaccine. You may need this if you have certain conditions or if you travel or work in places where you may be exposed to hepatitis A.  Hepatitis B vaccine. You may need this if you have certain conditions or if you travel or work in places where you may be exposed to hepatitis B.  Haemophilus influenzae type b (Hib) vaccine. You may need this  you have certain risk factors.  Talk to your health care provider about which screenings and vaccines you need and how often you need them. This information is not intended to replace advice given to you by your health care provider. Make sure you discuss any questions you have with your health care provider. Document Released: 12/30/2001 Document Revised: 07/23/2016  Document Reviewed: 09/04/2015 Elsevier Interactive Patient Education  2018 Elsevier Inc.  Preventive Care 18-39 Years, Female Preventive care refers to lifestyle choices and visits with your health care provider that can promote health and wellness. What does preventive care include?  A yearly physical exam. This is also called an annual well check.  Dental exams once or twice a year.  Routine eye exams. Ask your health care provider how often you should have your eyes checked.  Personal lifestyle choices, including: ? Daily care of your teeth and gums. ? Regular physical activity. ? Eating a healthy diet. ? Avoiding tobacco and drug use. ? Limiting alcohol use. ? Practicing safe sex. ? Taking vitamin and mineral supplements as recommended by your health care provider. What happens during an annual well check? The services and screenings done by your health care provider during your annual well check will depend on your age, overall health, lifestyle risk factors, and family history of disease. Counseling Your health care provider may ask you questions about your:  Alcohol use.  Tobacco use.  Drug use.  Emotional well-being.  Home and relationship well-being.  Sexual activity.  Eating habits.  Work and work environment.  Method of birth control.  Menstrual cycle.  Pregnancy history.  Screening You may have the following tests or measurements:  Height, weight, and BMI.  Diabetes screening. This is done by checking your blood sugar (glucose) after you have not eaten for a while (fasting).  Blood pressure.  Lipid and cholesterol levels. These may be checked every 5 years starting at age 20.  Skin check.  Hepatitis C blood test.  Hepatitis B blood test.  Sexually transmitted disease (STD) testing.  BRCA-related cancer screening. This may be done if you have a family history of breast, ovarian, tubal, or peritoneal cancers.  Pelvic exam and Pap test.  This may be done every 3 years starting at age 21. Starting at age 30, this may be done every 5 years if you have a Pap test in combination with an HPV test.  Discuss your test results, treatment options, and if necessary, the need for more tests with your health care provider. Vaccines Your health care provider may recommend certain vaccines, such as:  Influenza vaccine. This is recommended every year.  Tetanus, diphtheria, and acellular pertussis (Tdap, Td) vaccine. You may need a Td booster every 10 years.  Varicella vaccine. You may need this if you have not been vaccinated.  HPV vaccine. If you are 26 or younger, you may need three doses over 6 months.  Measles, mumps, and rubella (MMR) vaccine. You may need at least one dose of MMR. You may also need a second dose.  Pneumococcal 13-valent conjugate (PCV13) vaccine. You may need this if you have certain conditions and were not previously vaccinated.  Pneumococcal polysaccharide (PPSV23) vaccine. You may need one or two doses if you smoke cigarettes or if you have certain conditions.  Meningococcal vaccine. One dose is recommended if you are age 19-21 years and a first-year college student living in a residence hall, or if you have one of several medical conditions. You may also need   additional booster doses.  Hepatitis A vaccine. You may need this if you have certain conditions or if you travel or work in places where you may be exposed to hepatitis A.  Hepatitis B vaccine. You may need this if you have certain conditions or if you travel or work in places where you may be exposed to hepatitis B.  Haemophilus influenzae type b (Hib) vaccine. You may need this if you have certain risk factors.  Talk to your health care provider about which screenings and vaccines you need and how often you need them. This information is not intended to replace advice given to you by your health care provider. Make sure you discuss any questions you  have with your health care provider. Document Released: 12/30/2001 Document Revised: 07/23/2016 Document Reviewed: 09/04/2015 Elsevier Interactive Patient Education  2018 Elsevier Inc.  

## 2018-06-03 NOTE — Progress Notes (Signed)
Subjective:   Martha Freeman is a 42 y.o. No obstetric history on file. Caucasian female here for a routine well-woman exam.  [name] was 05/27/2018.    Current complaints: Pt reports that she was fired from previous job and since then has been depressed about current job. Declines discussion of treatment options at this time. Pt request to restart weight loss program. She reports that she has gained weight since job change. Pt reports cramping with periods and cramping after intercourse. She reports this is normal for her.    PCP: [place]       does desire labs  Social History: Sexual: homosexual Marital Status: married Living situation: with partner / significant other Occupation: Marketing executive at [employer]	[place] Tobacco/alcohol: smokes 1/2 pack every day Illicit drugs: no history of illicit drug use  The following portions of the patient's history were reviewed and updated as appropriate: allergies, current medications, past family history, past medical history, past social history, past surgical history and problem list.  Past Medical History Past Medical History:  Diagnosis Date  . Anxiety   . Depression   . Fatigue   . Heavy periods     Past Surgical History Past Surgical History:  Procedure Laterality Date  . RHINOPLASTY    . TONSILLECTOMY    . TUBAL LIGATION      Gynecologic History No obstetric history on file.  [name] was 05/27/2018. Contraception: tubal ligation Last Pap: 2018. Results were: normal  Obstetric History OB History  No data available    Current Medications Current Outpatient Medications on File Prior to Visit  Medication Sig Dispense Refill  . cetirizine (ZYRTEC) 10 MG tablet Take 10 mg by mouth daily.    . cyanocobalamin (,VITAMIN B-12,) 1000 MCG/ML injection INJECT 1 ML (1,000 MCG TOTAL) INTO THE MUSCLE ONCE.  2  . fesoterodine (TOVIAZ) 8 MG TB24 tablet Take 1 tablet (8 mg total)  by mouth daily. (Patient not taking: Reported on 06/03/2018) 30 tablet 2  . hydrocortisone (ANUSOL-HC) 2.5 % rectal cream Place 1 application rectally 2 (two) times daily. (Patient not taking: Reported on 06/03/2018) 30 g 2  . phentermine (ADIPEX-P) 37.5 MG tablet Take 1 tablet (37.5 mg total) by mouth daily before breakfast. (Patient not taking: Reported on 06/03/2018) 30 tablet 2   No current facility-administered medications on file prior to visit.     Review of Systems Patient denies any headaches, blurred vision, shortness of breath, chest pain, abdominal pain, problems with bowel movements, urination, or intercourse.  Objective:  BP 126/86   Pulse 97   Ht 5\' 7"  (1.702 m)   Wt 213 lb 11.2 oz (96.9 kg)   LMP 05/27/2018   BMI 33.47 kg/m  Physical Exam  General:  Well developed, well nourished, no acute distress. She is alert and oriented x3. Skin:  Warm and dry Neck:  Midline trachea, no thyromegaly or nodules Cardiovascular: Regular rate and rhythm, no murmur heard Lungs:  Effort normal, all lung fields clear to auscultation bilaterally Breasts:  No dominant palpable mass, retraction, or nipple discharge Abdomen:  Soft, non tender, no hepatosplenomegaly or masses Pelvic:  External genitalia is normal in appearance.  The vagina is normal in appearance. The cervix is bulbous, no CMT.  Thin prep pap is not done . Uterus is felt to be normal size, shape, and contour.  No adnexal masses or tenderness noted. Extremities:  No swelling or varicosities noted Psych:  She has a  normal mood and affect  Assessment:   Healthy well-woman exam Obesity Dysmenorrhea   Plan:  Labs obtained will follow up accordingly Restart Adipex and B12 for weight loss - spouse gives pt B12 shot Counseled on exercise, water intake, and good food choices  F/U 1 year for AE, or sooner if needed   Marla Pouliot Rockney Ghee, CNM

## 2018-06-04 LAB — THYROID PANEL WITH TSH
Free Thyroxine Index: 2.5 (ref 1.2–4.9)
T3 UPTAKE RATIO: 27 % (ref 24–39)
T4 TOTAL: 9.3 ug/dL (ref 4.5–12.0)
TSH: 2.37 u[IU]/mL (ref 0.450–4.500)

## 2018-06-04 LAB — CBC
HEMOGLOBIN: 12.9 g/dL (ref 11.1–15.9)
Hematocrit: 39.7 % (ref 34.0–46.6)
MCH: 28.4 pg (ref 26.6–33.0)
MCHC: 32.5 g/dL (ref 31.5–35.7)
MCV: 87 fL (ref 79–97)
PLATELETS: 352 10*3/uL (ref 150–450)
RBC: 4.54 x10E6/uL (ref 3.77–5.28)
RDW: 13.7 % (ref 12.3–15.4)
WBC: 7.6 10*3/uL (ref 3.4–10.8)

## 2018-06-04 LAB — COMPREHENSIVE METABOLIC PANEL
ALK PHOS: 57 IU/L (ref 39–117)
ALT: 13 IU/L (ref 0–32)
AST: 11 IU/L (ref 0–40)
Albumin/Globulin Ratio: 2 (ref 1.2–2.2)
Albumin: 4.6 g/dL (ref 3.5–5.5)
BILIRUBIN TOTAL: 0.3 mg/dL (ref 0.0–1.2)
BUN / CREAT RATIO: 13 (ref 9–23)
BUN: 12 mg/dL (ref 6–20)
CHLORIDE: 104 mmol/L (ref 96–106)
CO2: 21 mmol/L (ref 20–29)
CREATININE: 0.94 mg/dL (ref 0.57–1.00)
Calcium: 9.3 mg/dL (ref 8.7–10.2)
GFR calc Af Amer: 88 mL/min/{1.73_m2} (ref >59)
GFR calc non Af Amer: 77 mL/min/{1.73_m2} (ref >59)
GLUCOSE: 94 mg/dL (ref 65–99)
Globulin, Total: 2.3 g/dL (ref 1.5–4.5)
Potassium: 4.1 mmol/L (ref 3.5–5.2)
SODIUM: 143 mmol/L (ref 134–144)
Total Protein: 6.9 g/dL (ref 6.0–8.5)

## 2018-06-04 LAB — LIPID PANEL
Chol/HDL Ratio: 3 ratio (ref 0.0–4.4)
Cholesterol, Total: 124 mg/dL (ref 100–199)
HDL: 41 mg/dL (ref >39)
LDL Calculated: 75 mg/dL (ref 0–99)
TRIGLYCERIDES: 40 mg/dL (ref 0–149)
VLDL CHOLESTEROL CAL: 8 mg/dL (ref 5–40)

## 2018-06-04 LAB — HEMOGLOBIN A1C
ESTIMATED AVERAGE GLUCOSE: 108 mg/dL
Hgb A1c MFr Bld: 5.4 % (ref 4.8–5.6)

## 2018-10-26 ENCOUNTER — Other Ambulatory Visit: Payer: Self-pay | Admitting: Obstetrics and Gynecology

## 2018-10-26 ENCOUNTER — Telehealth: Payer: Self-pay | Admitting: Obstetrics and Gynecology

## 2018-10-26 ENCOUNTER — Other Ambulatory Visit: Payer: Self-pay | Admitting: *Deleted

## 2018-10-26 MED ORDER — HYDROCORTISONE 2.5 % RE CREA: 1.0000 "application " | TOPICAL_CREAM | Freq: Two times a day (BID) | RECTAL | 2 refills | Status: DC

## 2018-10-26 NOTE — Telephone Encounter (Signed)
The patient called and stated that she is having some issues with getting her Hemorid medication. The patient is hoping to get that medication sent to her preferred pharmacy [facility] in [place] and a call back for confirmation. Please advise.

## 2018-10-26 NOTE — Telephone Encounter (Signed)
Done-ac 

## 2019-03-30 ENCOUNTER — Other Ambulatory Visit: Payer: Self-pay | Admitting: *Deleted

## 2019-03-30 ENCOUNTER — Telehealth: Payer: Self-pay | Admitting: Obstetrics and Gynecology

## 2019-03-30 MED ORDER — CYANOCOBALAMIN 1000 MCG/ML IJ SOLN: INTRAMUSCULAR | 6 refills | Status: AC

## 2019-03-30 NOTE — Telephone Encounter (Signed)
The patient called and stated that she was unable to pick up her b-12 medication, The pharmacy informed the patient that the medication could not be refilled. The patient stated that she self administers her injection at home/Mel is aware. The patient is requesting a call for clarification/questions and a medication refill. Please advise.

## 2019-03-30 NOTE — Telephone Encounter (Signed)
Done-ac 

## 2019-05-24 ENCOUNTER — Other Ambulatory Visit: Payer: Self-pay | Admitting: Obstetrics and Gynecology

## 2019-06-09 ENCOUNTER — Encounter: Payer: Managed Care, Other (non HMO) | Admitting: Obstetrics and Gynecology

## 2019-06-15 ENCOUNTER — Other Ambulatory Visit: Payer: Self-pay

## 2019-06-15 ENCOUNTER — Ambulatory Visit (INDEPENDENT_AMBULATORY_CARE_PROVIDER_SITE_OTHER): Payer: Medicaid Other | Admitting: Obstetrics and Gynecology

## 2019-06-15 ENCOUNTER — Encounter: Payer: Self-pay | Admitting: Obstetrics and Gynecology

## 2019-06-15 VITALS — BP 113/62 | HR 82 | Ht 67.0 in | Wt 216.0 lb

## 2019-06-15 DIAGNOSIS — Z01411 Encounter for gynecological examination (general) (routine) with abnormal findings: Secondary | ICD-10-CM

## 2019-06-15 DIAGNOSIS — F17200 Nicotine dependence, unspecified, uncomplicated: Secondary | ICD-10-CM

## 2019-06-15 DIAGNOSIS — Z0000 Encounter for general adult medical examination without abnormal findings: Secondary | ICD-10-CM

## 2019-06-15 DIAGNOSIS — Z01419 Encounter for gynecological examination (general) (routine) without abnormal findings: Secondary | ICD-10-CM

## 2019-06-15 DIAGNOSIS — F331 Major depressive disorder, recurrent, moderate: Secondary | ICD-10-CM

## 2019-06-15 DIAGNOSIS — Z6833 Body mass index (BMI) 33.0-33.9, adult: Secondary | ICD-10-CM

## 2019-06-15 MED ORDER — PHENTERMINE HCL 37.5 MG PO TABS: 37.5000 mg | ORAL_TABLET | Freq: Every day | ORAL | 2 refills | Status: DC

## 2019-06-15 MED ORDER — HYDROCORTISONE (PERIANAL) 2.5 % EX CREA: 1.0000 "application " | TOPICAL_CREAM | Freq: Two times a day (BID) | CUTANEOUS | 0 refills | Status: DC

## 2019-06-15 MED ORDER — CYANOCOBALAMIN 1000 MCG/ML IJ SOLN: 1000.0000 ug | INTRAMUSCULAR | 1 refills | Status: DC

## 2019-06-15 NOTE — Progress Notes (Signed)
  Patient ID: Martha Freeman, caucasian female   DOB: Jan 02, 1976, 43 y.o.   MRN: [id number] SUBJECTIVE:  43 y.o. female in no distress for annual routine physical exam with no concerns.  Pt reports low mood 2/2 COVID pandemic and job loss. Declines medication at this time. Discussed counseling, but she is unsure if she wants to do that at this time. Declines lab work at this time.  Sexually active with single female partner, no concerns for STIs, tubal ligation.   Desires to restart weight loss program. Declines smoking cessation.   Current Outpatient Medications  Medication Sig Dispense Refill  . cetirizine (ZYRTEC) 10 MG tablet Take 10 mg by mouth daily.    . hydrocortisone (ANUSOL-HC) 2.5 % rectal cream PLACE 1 APPLICATION RECTALLY 2 TIMES DAILY 30 g 0  . cyanocobalamin (,VITAMIN B-12,) 1000 MCG/ML injection 1ML q monthly (Patient not taking: Reported on 06/15/2019) 1 mL 6  . fesoterodine (TOVIAZ) 8 MG TB24 tablet Take 1 tablet (8 mg total) by mouth daily. (Patient not taking: Reported on 06/03/2018) 30 tablet 2  . phentermine (ADIPEX-P) 37.5 MG tablet Take 1 tablet (37.5 mg total) by mouth daily before breakfast. (Patient not taking: Reported on 06/03/2018) 30 tablet 2   No current facility-administered medications for this visit.    Allergies: Aspirin  [name] was 05/17/2019.  ROS:  Feeling well. No dyspnea or chest pain on exertion.  No abdominal pain, change in bowel habits, black or bloody stools.  No urinary tract symptoms. GYN ROS: normal menses, no abnormal bleeding, pelvic pain or discharge, no breast pain or new or enlarging lumps on self exam. No neurological complaints.  OBJECTIVE:  The patient appears well, alert, oriented x 3, in no distress. BP 113/62   Pulse 82   Ht 5\' 7"  (1.702 m)   Wt 98 kg   LMP 05/17/2019   BMI 33.83 kg/m  ENT normal.  Neck supple. No adenopathy or thyromegaly. PERLA. Lungs are clear, good air entry, no wheezes, rhonchi  or rales. S1 and S2 normal, no murmurs, regular rate and rhythm. Abdomen soft without tenderness, guarding, mass or organomegaly. Extremities show no edema, normal peripheral pulses. Neurological is normal, no focal findings.  BREAST EXAM: breasts appear normal, no suspicious masses, no skin or nipple changes or axillary nodes  PELVIC EXAM: normal external genitalia, vulva, vagina, cervix, uterus and adnexa  ASSESSMENT:  well woman BMI 33 smoker   PLAN:  Mammogram ordered FU 1 month for weight check return in 1 yr for AE or sooner if needed

## 2019-07-15 ENCOUNTER — Encounter: Payer: Medicaid Other | Admitting: Obstetrics and Gynecology

## 2019-07-22 ENCOUNTER — Other Ambulatory Visit: Payer: Self-pay

## 2019-07-22 ENCOUNTER — Ambulatory Visit (INDEPENDENT_AMBULATORY_CARE_PROVIDER_SITE_OTHER): Payer: Self-pay | Admitting: Obstetrics and Gynecology

## 2019-07-22 ENCOUNTER — Encounter: Payer: Self-pay | Admitting: Obstetrics and Gynecology

## 2019-07-22 VITALS — BP 109/77 | HR 101 | Ht 67.0 in | Wt 209.0 lb

## 2019-07-22 DIAGNOSIS — Z7689 Persons encountering health services in other specified circumstances: Secondary | ICD-10-CM

## 2019-07-22 NOTE — Progress Notes (Signed)
Pt is here for wt, bp check, per pt "wife gives her b-12 " She is doing well  07/22/19 wt- 206lb 06/15/19 wt- 216lb  Waist 35.5in

## 2019-08-11 ENCOUNTER — Emergency Department
Admission: EM | Admit: 2019-08-11 | Discharge: 2019-08-11 | Disposition: A | Discharge status: Home / Self Care | Payer: Self-pay | Attending: Emergency Medicine | Admitting: Emergency Medicine

## 2019-08-11 ENCOUNTER — Encounter: Payer: Self-pay | Admitting: Emergency Medicine

## 2019-08-11 ENCOUNTER — Other Ambulatory Visit: Payer: Self-pay

## 2019-08-11 DIAGNOSIS — Y999 Unspecified external cause status: Secondary | ICD-10-CM | POA: Insufficient documentation

## 2019-08-11 DIAGNOSIS — Y92410 Unspecified street and highway as the place of occurrence of the external cause: Secondary | ICD-10-CM | POA: Insufficient documentation

## 2019-08-11 DIAGNOSIS — Y9389 Activity, other specified: Secondary | ICD-10-CM | POA: Insufficient documentation

## 2019-08-11 DIAGNOSIS — S50811A Abrasion of right forearm, initial encounter: Secondary | ICD-10-CM | POA: Insufficient documentation

## 2019-08-11 DIAGNOSIS — S80812A Abrasion, left lower leg, initial encounter: Secondary | ICD-10-CM | POA: Insufficient documentation

## 2019-08-11 DIAGNOSIS — F17210 Nicotine dependence, cigarettes, uncomplicated: Secondary | ICD-10-CM | POA: Insufficient documentation

## 2019-08-11 DIAGNOSIS — Z886 Allergy status to analgesic agent status: Secondary | ICD-10-CM | POA: Insufficient documentation

## 2019-08-11 MED ORDER — CYCLOBENZAPRINE HCL 5 MG PO TABS: ORAL_TABLET | ORAL | 0 refills | Status: DC

## 2019-08-11 MED ORDER — BACITRACIN-NEOMYCIN-POLYMYXIN 400-5-5000 EX OINT
TOPICAL_OINTMENT | Freq: Once | CUTANEOUS | Status: AC
  Administered 2019-08-11: 1 via TOPICAL
  Filled 2019-08-11: qty 1
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
## 2019-08-17 ENCOUNTER — Other Ambulatory Visit: Payer: Self-pay | Admitting: Chiropractor

## 2019-08-17 ENCOUNTER — Ambulatory Visit
Admission: RE | Admit: 2019-08-17 | Discharge: 2019-08-17 | Disposition: A | Discharge status: Home / Self Care | Payer: No Typology Code available for payment source | Attending: Chiropractor | Admitting: Chiropractor

## 2019-08-17 ENCOUNTER — Ambulatory Visit
Admission: RE | Admit: 2019-08-17 | Discharge: 2019-08-17 | Disposition: A | Discharge status: Home / Self Care | Payer: No Typology Code available for payment source | Source: Ambulatory Visit | Attending: Chiropractor | Admitting: Chiropractor

## 2019-08-17 DIAGNOSIS — M4050 Lordosis, unspecified, site unspecified: Secondary | ICD-10-CM | POA: Insufficient documentation

## 2019-08-17 DIAGNOSIS — S233XXA Sprain of ligaments of thoracic spine, initial encounter: Secondary | ICD-10-CM | POA: Insufficient documentation

## 2019-08-17 DIAGNOSIS — S134XXA Sprain of ligaments of cervical spine, initial encounter: Secondary | ICD-10-CM | POA: Diagnosis not present

## 2019-08-17 DIAGNOSIS — S336XXA Sprain of sacroiliac joint, initial encounter: Secondary | ICD-10-CM | POA: Diagnosis not present

## 2019-08-19 ENCOUNTER — Encounter: Payer: Medicaid Other | Admitting: Obstetrics and Gynecology

## 2019-09-09 ENCOUNTER — Other Ambulatory Visit: Payer: Self-pay | Admitting: Obstetrics and Gynecology

## 2019-09-14 ENCOUNTER — Ambulatory Visit (INDEPENDENT_AMBULATORY_CARE_PROVIDER_SITE_OTHER): Payer: Medicaid Other | Admitting: Obstetrics and Gynecology

## 2019-09-14 ENCOUNTER — Encounter: Payer: Self-pay | Admitting: Obstetrics and Gynecology

## 2019-09-14 ENCOUNTER — Other Ambulatory Visit: Payer: Self-pay

## 2019-09-14 VITALS — BP 112/82 | HR 89 | Ht 67.0 in | Wt 206.4 lb

## 2019-09-14 DIAGNOSIS — Z6832 Body mass index (BMI) 32.0-32.9, adult: Secondary | ICD-10-CM

## 2019-09-14 DIAGNOSIS — Z7689 Persons encountering health services in other specified circumstances: Secondary | ICD-10-CM

## 2019-09-14 DIAGNOSIS — E669 Obesity, unspecified: Secondary | ICD-10-CM

## 2019-09-14 MED ORDER — PHENTERMINE HCL 37.5 MG PO TABS: 37.5000 mg | ORAL_TABLET | Freq: Every day | ORAL | 2 refills | Status: DC

## 2019-09-14 NOTE — Progress Notes (Signed)
Pt presents for weight and B/P. No side effects of medication-Phentermine, or B-12.  Weight loss of 3 lbs. Encouraged eating healthy and exercise.  Last B12 was 2 months ago.   Waist 35 inches
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
## 2019-10-11 NOTE — Progress Notes (Signed)
Martha Freeman presents for weight, B/P and B-12 injection. No side effects of medications-Phentermine or B-12. Weight loss 0.2 lbs. Encouraged eating healthy and exercise.  No complaints.  Patient has refills on Phentermine, B12 injection given. Patient to RTC in 4 weeks with Dr.Cherry for weight management (previous [facility] patient).  Waist 35 inches.   BP 103/70   Pulse 99   Ht 5\' 7"  (1.702 m)   Wt 206 lb 0.2 oz (93.4 kg)   LMP 09/28/2019 (Approximate)   BMI 32.27 kg/m

## 2019-10-12 ENCOUNTER — Ambulatory Visit (INDEPENDENT_AMBULATORY_CARE_PROVIDER_SITE_OTHER): Payer: Self-pay | Admitting: Obstetrics and Gynecology

## 2019-10-12 ENCOUNTER — Other Ambulatory Visit: Payer: Self-pay

## 2019-10-12 VITALS — BP 103/70 | HR 99 | Ht 67.0 in | Wt 206.0 lb

## 2019-10-12 DIAGNOSIS — Z6832 Body mass index (BMI) 32.0-32.9, adult: Secondary | ICD-10-CM

## 2019-10-12 DIAGNOSIS — Z7689 Persons encountering health services in other specified circumstances: Secondary | ICD-10-CM

## 2019-10-12 DIAGNOSIS — Z713 Dietary counseling and surveillance: Secondary | ICD-10-CM

## 2019-10-12 MED ORDER — CYANOCOBALAMIN 1000 MCG/ML IJ SOLN
1000.0000 ug | Freq: Once | INTRAMUSCULAR | Status: AC
  Administered 2019-10-12: 1000 ug via INTRAMUSCULAR

## 2019-11-14 ENCOUNTER — Encounter: Payer: Medicaid Other | Admitting: Certified Nurse Midwife

## 2019-12-02 ENCOUNTER — Encounter: Payer: Medicaid Other | Admitting: Certified Nurse Midwife

## 2020-02-20 ENCOUNTER — Emergency Department: Payer: Self-pay

## 2020-02-20 ENCOUNTER — Emergency Department
Admission: EM | Admit: 2020-02-20 | Discharge: 2020-02-20 | Disposition: A | Discharge status: Home / Self Care | Payer: Self-pay | Attending: Emergency Medicine | Admitting: Emergency Medicine

## 2020-02-20 ENCOUNTER — Other Ambulatory Visit: Payer: Self-pay

## 2020-02-20 ENCOUNTER — Telehealth: Payer: Self-pay | Admitting: Obstetrics and Gynecology

## 2020-02-20 DIAGNOSIS — F17210 Nicotine dependence, cigarettes, uncomplicated: Secondary | ICD-10-CM | POA: Insufficient documentation

## 2020-02-20 DIAGNOSIS — N23 Unspecified renal colic: Secondary | ICD-10-CM | POA: Insufficient documentation

## 2020-02-20 DIAGNOSIS — N83201 Unspecified ovarian cyst, right side: Secondary | ICD-10-CM | POA: Insufficient documentation

## 2020-02-20 DIAGNOSIS — R102 Pelvic and perineal pain: Secondary | ICD-10-CM

## 2020-02-20 LAB — COMPREHENSIVE METABOLIC PANEL
ALT: 13 U/L (ref 0–44)
AST: 20 U/L (ref 15–41)
Albumin: 4.4 g/dL (ref 3.5–5.0)
Alkaline Phosphatase: 58 U/L (ref 38–126)
Anion gap: 13 (ref 5–15)
BUN: 12 mg/dL (ref 6–20)
CO2: 18 mmol/L — ABNORMAL LOW (ref 22–32)
Calcium: 9.4 mg/dL (ref 8.9–10.3)
Chloride: 106 mmol/L (ref 98–111)
Creatinine, Ser: 0.96 mg/dL (ref 0.44–1.00)
GFR calc Af Amer: >60 mL/min (ref >60)
GFR calc non Af Amer: >60 mL/min (ref >60)
Glucose, Bld: 121 mg/dL — ABNORMAL HIGH (ref 70–99)
Potassium: 3.2 mmol/L — ABNORMAL LOW (ref 3.5–5.1)
Sodium: 137 mmol/L (ref 135–145)
Total Bilirubin: 0.4 mg/dL (ref 0.3–1.2)
Total Protein: 7.6 g/dL (ref 6.5–8.1)

## 2020-02-20 LAB — CBC
HCT: 36.8 % (ref 36.0–46.0)
Hemoglobin: 12 g/dL (ref 12.0–15.0)
MCH: 26.5 pg (ref 26.0–34.0)
MCHC: 32.6 g/dL (ref 30.0–36.0)
MCV: 81.2 fL (ref 80.0–100.0)
Platelets: 446 10*3/uL — ABNORMAL HIGH (ref 150–400)
RBC: 4.53 MIL/uL (ref 3.87–5.11)
RDW: 14.2 % (ref 11.5–15.5)
WBC: 13.9 10*3/uL — ABNORMAL HIGH (ref 4.0–10.5)
nRBC: 0 % (ref 0.0–0.2)

## 2020-02-20 LAB — URINALYSIS, COMPLETE (UACMP) WITH MICROSCOPIC
Bacteria, UA: NONE SEEN
Bilirubin Urine: NEGATIVE
Glucose, UA: NEGATIVE mg/dL
Ketones, ur: 5 mg/dL — AB
Nitrite: NEGATIVE
Protein, ur: 100 mg/dL — AB
RBC / HPF: >50 RBC/hpf — ABNORMAL HIGH (ref 0–5)
Specific Gravity, Urine: 1.027 (ref 1.005–1.030)
pH: 7 (ref 5.0–8.0)

## 2020-02-20 LAB — LIPASE, BLOOD: Lipase: 25 U/L (ref 11–51)

## 2020-02-20 LAB — POCT PREGNANCY, URINE: Preg Test, Ur: NEGATIVE

## 2020-02-20 MED ORDER — ONDANSETRON 4 MG PO TBDP: 4.0000 mg | ORAL_TABLET | Freq: Three times a day (TID) | ORAL | 0 refills | Status: DC | PRN

## 2020-02-20 MED ORDER — CEPHALEXIN 500 MG PO CAPS: 500.0000 mg | ORAL_CAPSULE | Freq: Four times a day (QID) | ORAL | 0 refills | Status: AC

## 2020-02-20 MED ORDER — HYDROMORPHONE HCL 1 MG/ML IJ SOLN
1.0000 mg | Freq: Once | INTRAMUSCULAR | Status: AC
  Administered 2020-02-20: 12:00:00 1 mg via INTRAVENOUS
  Filled 2020-02-20: qty 1

## 2020-02-20 MED ORDER — ONDANSETRON HCL 4 MG/2ML IJ SOLN
4.0000 mg | Freq: Once | INTRAMUSCULAR | Status: AC
  Administered 2020-02-20: 12:00:00 4 mg via INTRAVENOUS
  Filled 2020-02-20: qty 2

## 2020-02-20 MED ORDER — TAMSULOSIN HCL 0.4 MG PO CAPS: 0.4000 mg | ORAL_CAPSULE | Freq: Every day | ORAL | 0 refills | Status: DC

## 2020-02-20 MED ORDER — SODIUM CHLORIDE 0.9% FLUSH: 3.0000 mL | Freq: Once | INTRAVENOUS | Status: DC

## 2020-02-20 MED ORDER — SODIUM CHLORIDE 0.9 % IV SOLN
1.0000 g | Freq: Once | INTRAVENOUS | Status: AC
  Administered 2020-02-20: 1 g via INTRAVENOUS
  Filled 2020-02-20: qty 10

## 2020-02-20 MED ORDER — SODIUM CHLORIDE 0.9 % IV SOLN: Freq: Once | INTRAVENOUS | Status: AC

## 2020-02-20 MED ORDER — OXYCODONE-ACETAMINOPHEN 5-325 MG PO TABS: 1.0000 | ORAL_TABLET | Freq: Three times a day (TID) | ORAL | 0 refills | Status: DC | PRN

## 2020-02-20 NOTE — ED Provider Notes (Signed)
[facility] Emergency Department Provider Note       Time seen: ----------------------------------------- 11:20 AM on 02/20/2020 -----------------------------------------   I have reviewed the triage vital signs and the nursing notes.  HISTORY   Chief Complaint Abdominal Pain   HPI Martha Freeman is a 44 y.o. female with a history of anxiety, depression, IBS, interstitial cystitis who presents to the ED for lower abdominal pain that started 4 days ago.  Pain is 10 out of 10.  She has had nausea vomiting but denies any diarrhea.  She is also seeing some hematuria.  Past Medical History:  Diagnosis Date  . Anxiety   . Depression   . Fatigue   . Heavy periods     There are no problems to display for this patient.   Past Surgical History:  Procedure Laterality Date  . RHINOPLASTY    . TONSILLECTOMY    . TUBAL LIGATION      Allergies Aspirin  Social History Social History   Tobacco Use  . Smoking status: Current Every Day Smoker    Packs/day: 0.25    Years: 10.00    Pack years: 2.50    Types: Cigarettes  . Smokeless tobacco: Never Used  Substance Use Topics  . Alcohol use: Yes    Alcohol/week: 0.0 standard drinks    Comment: occasional   . Drug use: No    Review of Systems Constitutional: Negative for fever. Cardiovascular: Negative for chest pain. Respiratory: Negative for shortness of breath. Gastrointestinal: Positive for abdominal pain, vomiting Genitourinary: Positive for hematuria Musculoskeletal: Negative for back pain. Skin: Negative for rash. Neurological: Negative for headaches, focal weakness or numbness.  All systems negative/normal/unremarkable except as stated in the HPI  ____________________________________________   PHYSICAL EXAM:  VITAL SIGNS: ED Triage Vitals  Enc Vitals Group     BP 02/20/20 0940 125/86     Pulse Rate 02/20/20 0940 67     Resp 02/20/20 0940 20     Temp 02/20/20 0940 98 F (36.7 C)      Temp src --      SpO2 02/20/20 0940 100 %     Weight 02/20/20 0941 205 lb (93 kg)     Height 02/20/20 0941 5\' 7"  (1.702 m)     Head Circumference --      Peak Flow --      Pain Score 02/20/20 0940 10     Pain Loc --      Pain Edu? --      Excl. in [place]? --     Constitutional: Alert and oriented.  Mild to moderate distress from pain Eyes: Conjunctivae are normal. Normal extraocular movements. Cardiovascular: Normal rate, regular rhythm. No murmurs, rubs, or gallops. Respiratory: Normal respiratory effort without tachypnea nor retractions. Breath sounds are clear and equal bilaterally. No wheezes/rales/rhonchi. Gastrointestinal: Suprapubic tenderness, no rebound or guarding.  Normal bowel sounds. Musculoskeletal: Nontender with normal range of motion in extremities. No lower extremity tenderness nor edema. Neurologic:  Normal speech and language. No gross focal neurologic deficits are appreciated.  Skin:  Skin is warm, dry and intact. No rash noted. Psychiatric: Mood and affect are normal. Speech and behavior are normal.  ____________________________________________  ED COURSE:  As part of my medical decision making, I reviewed the following data within the [facility] History obtained from family if available, nursing notes, old chart and ekg, as well as notes from prior ED visits. Patient presented for abdominal pain, we will assess  with labs and imaging as indicated at this time.   Procedures  Martha Freeman was evaluated in Emergency Department on 02/20/2020 for the symptoms described in the history of present illness. She was evaluated in the context of the global COVID-19 pandemic, which necessitated consideration that the patient might be at risk for infection with the SARS-CoV-2 virus that causes COVID-19. Institutional protocols and algorithms that pertain to the evaluation of patients at risk for COVID-19 are in a state of rapid change based on information  released by regulatory bodies including the CDC and federal and state organizations. These policies and algorithms were followed during the patient's care in the ED.  ____________________________________________   LABS (pertinent positives/negatives)  Labs Reviewed  COMPREHENSIVE METABOLIC PANEL - Abnormal; Notable for the following components:      Result Value   Potassium 3.2 (*)    CO2 18 (*)    Glucose, Bld 121 (*)    All other components within normal limits  CBC - Abnormal; Notable for the following components:   WBC 13.9 (*)    Platelets 446 (*)    All other components within normal limits  URINALYSIS, COMPLETE (UACMP) WITH MICROSCOPIC - Abnormal; Notable for the following components:   Color, Urine AMBER (*)    APPearance CLOUDY (*)    Hgb urine dipstick LARGE (*)    Ketones, ur 5 (*)    Protein, ur 100 (*)    Leukocytes,Ua TRACE (*)    RBC / HPF >50 (*)    All other components within normal limits  LIPASE, BLOOD  POC URINE PREG, ED  POCT PREGNANCY, URINE    RADIOLOGY Images were viewed by me  CT of the abdomen pelvis  IMPRESSION:  1. 3 x 5 mm distal right ureteral calculus resulting in mild  right-sided hydronephrosis and ureteral distension.  2. Septated cystic adnexal lesion on the right measures 6.7 x 5.5 x  8.0 cm previously 5.0 x 3.7 cm. calcifications are noted. Question  of septal thickening and perhaps mural nodularity along the superior  margin. This was present in 2014 but has enlarged since that time.  Low-grade epithelial neoplasm is not excluded. Pelvic sonogram on  follow-up may be helpful.  3. Possible sludge in the gallbladder without pericholecystic  stranding.  4. Unchanged mild haziness at the root of the small bowel mesentery  and associated small lymph nodes, potentially reflecting sequela of  previous inflammation. Stable since 2014 and not associated with  retroperitoneal adenopathy or splenic enlargement.  IMPRESSION:  Unremarkable  uterus, endometrial complex, and LEFT ovary.   Complex cystic mass of the RIGHT ovary 8.2 x 5.2 x 7.6 cm containing  thick irregular septations and areas of soft tissue thickening/mural  nodularity peripherally.   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   Findings called to Dr. Jimmye Norman on 02/20/2020 at 1440 hrs.  ____________________________________________   DIFFERENTIAL DIAGNOSIS   Renal colic, UTI, pyelonephritis, PID, ovarian cyst, torsion, appendicitis, interstitial cystitis  FINAL ASSESSMENT AND PLAN  Abdominal pain, renal colic, ovarian cyst   Plan: The patient had presented for abdominal pain and vomiting. Patient's labs did reveal leukocytosis and hematuria. Patient's imaging revealed a 3 x 5 mm distal right ureteral calculus but also a right ovarian mass.  Pain is under control and she is currently comfortable.  She was given IV Rocephin to prevent any urinary infection from developing.  She will be discharged with antibiotics and pain medicine.  I have discussed with GYN on-call who will see her with close outpatient follow-up.  Dr. Amalia Hailey states she can call his office for close follow-up.   Laurence Aly, MD    Note: This note was generated in part or whole with voice recognition software. Voice recognition is usually quite accurate but there are transcription errors that can and very often do occur. I apologize for any typographical errors that were not detected and corrected.     Earleen Newport, MD 02/20/20 [phone]

## 2020-02-20 NOTE — ED Triage Notes (Signed)
Pt comes into the ED via EMS from home with c/o lower abd pain for a while and since walking her dog this morning it is extreme and cant take it anymore. Pt having periods of hyperventilation and redirected.

## 2020-02-20 NOTE — ED Notes (Signed)
Pt transported to CT ?

## 2020-02-20 NOTE — Telephone Encounter (Signed)
Pt called in, was just discharged from the ER, the pt was told to call to schedule for her surgery I informed the pt I would send a message.  I called back &  talked to [facility] and she looked into it. She said that this visit would be a Ed follow up 30 mins visit. The pt can either be seen by cherry or evans. I called pt and schud. appt

## 2020-02-20 NOTE — ED Triage Notes (Signed)
Pt here via [facility] from home with c/o lower abdominal pain that started about 4 days ago. Pt states 10/10 pain.  Pt states nausea, vomiting denies any diarrhea.

## 2020-02-28 ENCOUNTER — Other Ambulatory Visit: Payer: Self-pay

## 2020-02-28 ENCOUNTER — Ambulatory Visit (INDEPENDENT_AMBULATORY_CARE_PROVIDER_SITE_OTHER): Payer: Medicaid Other | Admitting: Obstetrics and Gynecology

## 2020-02-28 ENCOUNTER — Encounter: Payer: Self-pay | Admitting: Obstetrics and Gynecology

## 2020-02-28 VITALS — BP 124/82 | HR 81 | Ht 67.0 in | Wt 206.0 lb

## 2020-02-28 DIAGNOSIS — N83299 Other ovarian cyst, unspecified side: Secondary | ICD-10-CM

## 2020-02-28 DIAGNOSIS — R102 Pelvic and perineal pain: Secondary | ICD-10-CM

## 2020-02-28 NOTE — Progress Notes (Signed)
HPI:      [name] is a 44 y.o. [id number] who LMP was [name] was 02/13/2020.  Subjective:   She presents today for follow-up of the emergency department visit for right-sided pelvic pain.  She was found to have ureteral calculi and a UTI with hematuria.  She continues to take antibiotics and Azo for her UTI.  At the time of CT she was found to have a large right ovarian cyst. A follow-up ultrasound with Doppler flow revealed thickened septations and increased blood flow within the septations.  This was read as suspicious for neoplasm. The patient has no other symptoms at this time.  She does state that she has been "followed for a cyst on her ovary".  This seems like it may be true because the CT states that this cyst was seen before and that it has now "enlarged".    Hx: The following portions of the patient's history were reviewed and updated as appropriate:             She  has a past medical history of Anxiety, Depression, Fatigue, and Heavy periods. She does not have a problem list on file. She  has a past surgical history that includes Rhinoplasty; Tubal ligation; and Tonsillectomy. Her family history includes Cancer in her mother and paternal aunt; Heart disease in her maternal grandmother. She  reports that she has been smoking cigarettes. She has a 2.50 pack-year smoking history. She has never used smokeless tobacco. She reports current alcohol use. She reports that she does not use drugs. She has a current medication list which includes the following prescription(s): cephalexin, cyanocobalamin, hydrocortisone, ondansetron, oxycodone-acetaminophen, tamsulosin, and phentermine. She is allergic to aspirin.       Review of Systems:  Review of Systems  Constitutional: Denied constitutional symptoms, night sweats, recent illness, fatigue, fever, insomnia and weight loss.  Eyes: Denied eye symptoms, eye pain, photophobia, vision change and visual  disturbance.  Ears/Nose/Throat/Neck: Denied ear, nose, throat or neck symptoms, hearing loss, nasal discharge, sinus congestion and sore throat.  Cardiovascular: Denied cardiovascular symptoms, arrhythmia, chest pain/pressure, edema, exercise intolerance, orthopnea and palpitations.  Respiratory: Denied pulmonary symptoms, asthma, pleuritic pain, productive sputum, cough, dyspnea and wheezing.  Gastrointestinal: Denied, gastro-esophageal reflux, melena, nausea and vomiting.  Genitourinary: See HPI for additional information.  Musculoskeletal: Denied musculoskeletal symptoms, stiffness, swelling, muscle weakness and myalgia.  Dermatologic: Denied dermatology symptoms, rash and scar.  Neurologic: Denied neurology symptoms, dizziness, headache, neck pain and syncope.  Psychiatric: Denied psychiatric symptoms, anxiety and depression.  Endocrine: Denied endocrine symptoms including hot flashes and night sweats.   Meds:   Current Outpatient Medications on File Prior to Visit  Medication Sig Dispense Refill  . cephALEXin (KEFLEX) 500 MG capsule Take 1 capsule (500 mg total) by mouth 4 (four) times daily for 10 days. 40 capsule 0  . cyanocobalamin (,VITAMIN B-12,) 1000 MCG/ML injection 1ML q monthly 1 mL 6  . hydrocortisone (ANUSOL-HC) 2.5 % rectal cream PLACE 1 APPLICATION RECTALLY 2 (TWO) TIMES DAILY. 28.35 g 1  . ondansetron (ZOFRAN ODT) 4 MG disintegrating tablet Take 1 tablet (4 mg total) by mouth every 8 (eight) hours as needed for nausea or vomiting. 20 tablet 0  . oxyCODONE-acetaminophen (PERCOCET) 5-325 MG tablet Take 1 tablet by mouth every 8 (eight) hours as needed. 20 tablet 0  . tamsulosin (FLOMAX) 0.4 MG CAPS capsule Take 1 capsule (0.4 mg total) by mouth daily after breakfast. 30 capsule 0  .  phentermine (ADIPEX-P) 37.5 MG tablet Take 1 tablet (37.5 mg total) by mouth daily before breakfast. (Patient not taking: Reported on 02/28/2020) 30 tablet 2   No current facility-administered  medications on file prior to visit.    Objective:     Vitals:   02/28/20 1327  BP: 124/82  Pulse: 81              Enlarged complex ovarian cyst with increased Doppler blood flow.  Assessment:    [id number] There are no problems to display for this patient.    1. Complex ovarian cyst     Ultrasound reveals complex ovarian cyst suspicious for neoplasm  Plan:            1.  Recommend ROMA testing.  Patient has expressed her desire for hysterectomy at the time of oophorectomy if necessary.  We have discussed the possibility of referral to GYN oncology if necessary.  Complex versus simple cyst discussed in detail septations and blood flow also discussed.  All questions answered   Orders Orders Placed This Encounter  Procedures  . Ovarian Malignancy Risk-ROMA    No orders of the defined types were placed in this encounter.     F/U  No follow-ups on file. I spent 31 minutes involved in the care of this patient preparing to see the patient by obtaining and reviewing her medical history (including labs, imaging tests and prior procedures), documenting clinical information in the electronic health record (EHR), counseling and coordinating care plans, writing and sending prescriptions, ordering tests or procedures and directly communicating with the patient by discussing pertinent items from her history and physical exam as well as detailing my assessment and plan as noted above so that she has an informed understanding.  All of her questions were answered.  Finis Bud, M.D. 02/28/2020 2:06 PM

## 2020-02-29 LAB — OVARIAN MALIGNANCY RISK-ROMA
Cancer Antigen (CA) 125: 8.2 U/mL (ref 0.0–38.1)
HE4: 64.8 pmol/L — ABNORMAL HIGH (ref 0.0–63.6)
Postmenopausal ROMA: 0.99
Premenopausal ROMA: 1.26 — ABNORMAL HIGH

## 2020-02-29 LAB — PREMENOPAUSAL INTERP: HIGH

## 2020-02-29 LAB — POSTMENOPAUSAL INTERP: LOW

## 2020-02-29 NOTE — Progress Notes (Signed)
03/01/20 2:00 PM   Drum[place] 1976/04/11 [id number]  Referring provider: [facility] [address] [place],  [place] [address]  Chief Complaint  Patient presents with  . Hematuria    HPI: Martha Freeman is a 44 y.o. F with history of IBS and IC presents today for the evaluation and management of gross hematuria.   She last saw a urologist in 2014 for treatment if IC which was been managed conservatively.   Presented to ED on 02/20/20 c/o of lower abdomina pain onset 4 days prior to visit with nausea, vomiting and hematuria. F/u CT imaging revealed a 3 x 5 mm distal right ureteral calculus resulting in mild right sided hydronephrosis and ureteral distension. Urinalysis positive for >50 RBC/hpf, 11-20 WBC, 100 protein, and 5 ketones. She was discharged with antibiotics and pain meds.   Her CT also indicated an incidental right adnexal mass measuring 8.2 x 5.2 x 7.6 cm. She is being followed by Dr. Amalia Hailey, OB/GYN, and is being scheduled for hysterectomy and oophorectomy in near future.  This is her first kidney stone episode and has been drinking a lot of water due to being really thirsty. She thinks she passed the stone the Saturday after ED visit however is not sure and has not checked. She has not witnessed any stone type pain recently however reports of cramps yesterday.  PMH: Past Medical History:  Diagnosis Date  . Anxiety   . Depression   . Fatigue   . Heavy periods     Surgical History: Past Surgical History:  Procedure Laterality Date  . RHINOPLASTY    . TONSILLECTOMY    . TUBAL LIGATION      Home Medications:  Allergies as of 03/01/2020      Reactions   Aspirin       Medication List       Accurate as of March 01, 2020 11:59 PM. If you have any questions, ask your nurse or doctor.        cephALEXin 500 MG capsule Commonly known as: KEFLEX Take 1 capsule (500 mg total) by mouth 4 (four) times daily for 10 days.   cyanocobalamin 1000 MCG/ML injection Commonly known as: (VITAMIN B-12) 1ML q monthly   hydrocortisone 2.5 % rectal cream Commonly known as: ANUSOL-HC PLACE 1 APPLICATION RECTALLY 2 (TWO) TIMES DAILY.   ondansetron 4 MG disintegrating tablet Commonly known as: Zofran ODT Take 1 tablet (4 mg total) by mouth every 8 (eight) hours as needed for nausea or vomiting.   oxyCODONE-acetaminophen 5-325 MG tablet Commonly known as: Percocet Take 1 tablet by mouth every 8 (eight) hours as needed.   phentermine 37.5 MG tablet Commonly known as: Adipex-P Take 1 tablet (37.5 mg total) by mouth daily before breakfast.   tamsulosin 0.4 MG Caps capsule Commonly known as: FLOMAX Take 1 capsule (0.4 mg total) by mouth daily after breakfast.       Allergies:  Allergies  Allergen Reactions  . Aspirin     "I don't know what kind of reaction I had. It was when I was little."    Family History: Family History  Problem Relation Age of Onset  . Cancer Mother        ENDOMETRIAL CANCER  . Cancer Paternal Aunt        BREAST  . Heart disease Maternal Grandmother   . Breast cancer Neg Hx   . Ovarian cancer Neg Hx     Social History:  reports that she has been smoking cigarettes. She has a 2.50 pack-year smoking history. She has never used smokeless tobacco. She reports current alcohol use. She reports that she does not use drugs.   Physical Exam: BP (!) 149/87   Pulse (!) 128   Ht 5\' 7"  (1.702 m)   Wt 204 lb (92.5 kg)   LMP 02/13/2020 Comment: neg preg test 02/20/20  BMI 31.95 kg/m   Constitutional:  Alert and oriented, No acute distress. HEENT: [place] AT, moist mucus membranes.  Trachea midline, no masses. Cardiovascular: No clubbing, cyanosis, or edema. Respiratory: Normal respiratory effort, no increased work of breathing. Skin: No rashes, bruises or suspicious lesions. Neurologic: Grossly intact, no focal deficits, moving all 4 extremities. Psychiatric: Normal mood and  affect.  Laboratory Data:  Lab Results  Component Value Date   CREATININE 0.94 03/02/2020   Pertinent Imaging:  Results for orders placed during the hospital encounter of 02/20/20  CT Renal Stone Study   Addendum ADDENDUM REPORT: 02/20/2020 12:16  ADDENDUM: These results were called by telephone at the time of interpretation on 02/20/2020 at 12:16 pm to provider Lenise Arena , who verbally acknowledged these results.   Electronically Signed   By: Zetta Bills M.D.   On: 02/20/2020 12:16       Felipa Emory, MD 02/20/2020 12:19 PM         Narrative CLINICAL DATA:  Flank pain kidney expected  EXAM: CT ABDOMEN AND PELVIS WITHOUT CONTRAST  TECHNIQUE: Multidetector CT imaging of the abdomen and pelvis was performed following the standard protocol without IV contrast.  COMPARISON:  05/05/2013  FINDINGS: Lower chest: Incidental imaging of the lung bases is unremarkable.  Hepatobiliary: No focal, suspicious hepatic lesion. Possible sludge in the gallbladder without pericholecystic stranding.  Pancreas: Pancreas is normal without inflammation ductal dilation or lesion.  Spleen: Spleen is normal size without focal lesion.  Adrenals/Urinary Tract: Adrenal glands are normal.  Mild right hydronephrosis and ureteral distension terminating in the distal right ureter at the site of a ureteral calculus (image 81, series 2) this measures 3 x 5 mm. Mild perinephric stranding is demonstrated as well. No renal calculi.  Stomach/Bowel: Mild gastric distension. No acute gastrointestinal process. Normal appendix. Scattered mesenteric lymph nodes and some subtle stranding in the root of the small bowel mesentery, a stable finding.  Vascular/Lymphatic: Normal caliber abdominal aorta. No adenopathy in the retroperitoneum.  Reproductive: Septated cystic adnexal lesion on the right measures 6.7 x 5.5 x 8.0 cm previously 5.0 x 3.7 cm in axial dimension. Septa demonstrate calcifications.  Subtle increase in calcifications are noted. Question of septal thickening and perhaps mural nodularity along the superior margin.  No pelvic ascites.  No pelvic adenopathy.  Other: No ascites or omental nodularity.  Musculoskeletal: No acute bone finding or destructive bone process.  IMPRESSION: 1. 3 x 5 mm distal right ureteral calculus resulting in mild right-sided hydronephrosis and ureteral distension. 2. Septated cystic adnexal lesion on the right measures 6.7 x 5.5 x 8.0 cm previously 5.0 x 3.7 cm. calcifications are noted. Question of septal thickening and perhaps mural nodularity along the superior margin. This was present in 2014 but has enlarged since that time. Low-grade epithelial neoplasm is not excluded. Pelvic sonogram on follow-up may be helpful. 3. Possible sludge in the gallbladder without pericholecystic stranding. 4. Unchanged mild haziness at the root of the small bowel mesentery and associated small lymph nodes, potentially reflecting sequela of previous inflammation. Stable since 2014 and not associated with  retroperitoneal adenopathy or splenic enlargement.  Electronically Signed: By: Zetta Bills M.D. On: 02/20/2020 12:09    I have personally reviewed the images and agree with radiologist interpretation.   Assessment & Plan:    1. Kidney stones Highly suspect interval passage due to clear urine today and lack of pain Encouraged to continue water intake  KUB today- likely will be difficult to interpret due to presence of phleboliths Will be called by radiologist to schedule RUS to ensure stone has passed especially important to ensure prior to hysterectomy' oophrectomy  Advised to call back if she experiences pain  Literature given on kidney stone prevention   [facility] [address] [place], [place] [address] [phone]  I, Lucas Mallow, am acting as a scribe for Dr. Hollice Espy,  I have  reviewed the above documentation for accuracy and completeness, and I agree with the above.   Hollice Espy, MD

## 2020-03-01 ENCOUNTER — Other Ambulatory Visit: Payer: Self-pay

## 2020-03-01 ENCOUNTER — Encounter: Payer: Self-pay | Admitting: Urology

## 2020-03-01 ENCOUNTER — Ambulatory Visit
Admission: RE | Admit: 2020-03-01 | Discharge: 2020-03-01 | Disposition: A | Discharge status: Home / Self Care | Payer: Self-pay | Source: Ambulatory Visit | Attending: Urology | Admitting: Urology

## 2020-03-01 ENCOUNTER — Ambulatory Visit (INDEPENDENT_AMBULATORY_CARE_PROVIDER_SITE_OTHER): Payer: Self-pay | Admitting: Urology

## 2020-03-01 VITALS — BP 149/87 | HR 128 | Ht 67.0 in | Wt 204.0 lb

## 2020-03-01 DIAGNOSIS — N200 Calculus of kidney: Secondary | ICD-10-CM

## 2020-03-01 DIAGNOSIS — R319 Hematuria, unspecified: Secondary | ICD-10-CM

## 2020-03-01 LAB — URINALYSIS, COMPLETE
Bilirubin, UA: NEGATIVE
Glucose, UA: NEGATIVE
Ketones, UA: NEGATIVE
Leukocytes,UA: NEGATIVE
Nitrite, UA: POSITIVE — AB
Protein,UA: NEGATIVE
RBC, UA: NEGATIVE
Specific Gravity, UA: 1.015 (ref 1.005–1.030)
Urobilinogen, Ur: 0.2 mg/dL (ref 0.2–1.0)
pH, UA: 6.5 (ref 5.0–7.5)

## 2020-03-01 NOTE — Patient Instructions (Signed)
Will call with results

## 2020-03-02 ENCOUNTER — Emergency Department
Admission: EM | Admit: 2020-03-02 | Discharge: 2020-03-03 | Disposition: A | Discharge status: Home / Self Care | Payer: Self-pay | Attending: Emergency Medicine | Admitting: Emergency Medicine

## 2020-03-02 ENCOUNTER — Emergency Department: Payer: Self-pay

## 2020-03-02 ENCOUNTER — Other Ambulatory Visit: Payer: Self-pay

## 2020-03-02 ENCOUNTER — Encounter: Payer: Self-pay | Admitting: Emergency Medicine

## 2020-03-02 DIAGNOSIS — R102 Pelvic and perineal pain: Secondary | ICD-10-CM | POA: Insufficient documentation

## 2020-03-02 DIAGNOSIS — K5900 Constipation, unspecified: Secondary | ICD-10-CM | POA: Insufficient documentation

## 2020-03-02 DIAGNOSIS — R9389 Abnormal findings on diagnostic imaging of other specified body structures: Secondary | ICD-10-CM | POA: Insufficient documentation

## 2020-03-02 DIAGNOSIS — K649 Unspecified hemorrhoids: Secondary | ICD-10-CM | POA: Insufficient documentation

## 2020-03-02 DIAGNOSIS — Z87442 Personal history of urinary calculi: Secondary | ICD-10-CM | POA: Insufficient documentation

## 2020-03-02 DIAGNOSIS — F17210 Nicotine dependence, cigarettes, uncomplicated: Secondary | ICD-10-CM | POA: Insufficient documentation

## 2020-03-02 DIAGNOSIS — R1084 Generalized abdominal pain: Secondary | ICD-10-CM | POA: Insufficient documentation

## 2020-03-02 LAB — CBC WITH DIFFERENTIAL/PLATELET
Abs Immature Granulocytes: 0.11 10*3/uL — ABNORMAL HIGH (ref 0.00–0.07)
Basophils Absolute: 0 10*3/uL (ref 0.0–0.1)
Basophils Relative: 0 %
Eosinophils Absolute: 0 10*3/uL (ref 0.0–0.5)
Eosinophils Relative: 0 %
HCT: 37.9 % (ref 36.0–46.0)
Hemoglobin: 12.5 g/dL (ref 12.0–15.0)
Immature Granulocytes: 1 %
Lymphocytes Relative: 6 %
Lymphs Abs: 1.2 10*3/uL (ref 0.7–4.0)
MCH: 26.8 pg (ref 26.0–34.0)
MCHC: 33 g/dL (ref 30.0–36.0)
MCV: 81.2 fL (ref 80.0–100.0)
Monocytes Absolute: 0.6 10*3/uL (ref 0.1–1.0)
Monocytes Relative: 3 %
Neutro Abs: 17.3 10*3/uL — ABNORMAL HIGH (ref 1.7–7.7)
Neutrophils Relative %: 90 %
Platelets: 455 10*3/uL — ABNORMAL HIGH (ref 150–400)
RBC: 4.67 MIL/uL (ref 3.87–5.11)
RDW: 13.5 % (ref 11.5–15.5)
WBC: 19.2 10*3/uL — ABNORMAL HIGH (ref 4.0–10.5)
nRBC: 0 % (ref 0.0–0.2)

## 2020-03-02 LAB — COMPREHENSIVE METABOLIC PANEL
ALT: 21 U/L (ref 0–44)
AST: 18 U/L (ref 15–41)
Albumin: 4.6 g/dL (ref 3.5–5.0)
Alkaline Phosphatase: 55 U/L (ref 38–126)
Anion gap: 11 (ref 5–15)
BUN: 12 mg/dL (ref 6–20)
CO2: 20 mmol/L — ABNORMAL LOW (ref 22–32)
Calcium: 9.6 mg/dL (ref 8.9–10.3)
Chloride: 103 mmol/L (ref 98–111)
Creatinine, Ser: 0.94 mg/dL (ref 0.44–1.00)
GFR calc Af Amer: >60 mL/min (ref >60)
GFR calc non Af Amer: >60 mL/min (ref >60)
Glucose, Bld: 127 mg/dL — ABNORMAL HIGH (ref 70–99)
Potassium: 3.8 mmol/L (ref 3.5–5.1)
Sodium: 134 mmol/L — ABNORMAL LOW (ref 135–145)
Total Bilirubin: 0.6 mg/dL (ref 0.3–1.2)
Total Protein: 8.1 g/dL (ref 6.5–8.1)

## 2020-03-02 LAB — LIPASE, BLOOD: Lipase: 21 U/L (ref 11–51)

## 2020-03-02 LAB — SAMPLE TO BLOOD BANK

## 2020-03-02 MED ORDER — HYDROMORPHONE HCL 1 MG/ML IJ SOLN
1.0000 mg | Freq: Once | INTRAMUSCULAR | Status: AC
  Administered 2020-03-03: 1 mg via INTRAVENOUS
  Filled 2020-03-02: qty 1

## 2020-03-02 MED ORDER — SODIUM CHLORIDE 0.9 % IV BOLUS
1000.0000 mL | Freq: Once | INTRAVENOUS | Status: AC
  Administered 2020-03-03: 1000 mL via INTRAVENOUS

## 2020-03-02 MED ORDER — LACTULOSE 10 GM/15ML PO SOLN
30.0000 g | Freq: Once | ORAL | Status: AC
  Administered 2020-03-03: 30 g via ORAL
  Filled 2020-03-02: qty 60

## 2020-03-02 MED ORDER — MORPHINE SULFATE (PF) 4 MG/ML IV SOLN
INTRAVENOUS | Status: AC
  Filled 2020-03-02: qty 1

## 2020-03-02 MED ORDER — ONDANSETRON HCL 4 MG/2ML IJ SOLN
INTRAMUSCULAR | Status: AC
  Filled 2020-03-02: qty 2

## 2020-03-02 MED ORDER — MORPHINE SULFATE (PF) 4 MG/ML IV SOLN
4.0000 mg | Freq: Once | INTRAVENOUS | Status: AC
  Administered 2020-03-02: 4 mg via INTRAVENOUS

## 2020-03-02 MED ORDER — ONDANSETRON HCL 4 MG/2ML IJ SOLN
4.0000 mg | Freq: Once | INTRAMUSCULAR | Status: AC
  Administered 2020-03-02: 4 mg via INTRAVENOUS

## 2020-03-02 MED ORDER — LIDOCAINE HCL URETHRAL/MUCOSAL 2 % EX GEL
1.0000 "application " | Freq: Once | CUTANEOUS | Status: AC
  Administered 2020-03-03: 1 via TOPICAL
  Filled 2020-03-02: qty 10

## 2020-03-02 MED ORDER — ONDANSETRON HCL 4 MG/2ML IJ SOLN
4.0000 mg | Freq: Once | INTRAMUSCULAR | Status: AC
  Administered 2020-03-03: 4 mg via INTRAVENOUS
  Filled 2020-03-02: qty 2

## 2020-03-02 NOTE — ED Provider Notes (Signed)
[facility] Emergency Department Provider Note   ____________________________________________   First MD Initiated Contact with Patient 03/02/20 2338     (approximate)  I have reviewed the triage vital signs and the nursing notes.   HISTORY  Chief Complaint No chief complaint on file.    HPI Martha Freeman is a 44 y.o. female who presents to the ED from home with a chief complaint of abdominal pain, constipation and bleeding hemorrhoids.  Patient was seen in the ED on 4/5 and found to have a large ovarian cyst as well is right ureteral stone.  She has followed up with Dr. Amalia Hailey from [place] and is currently in the process of having a malignancy work-up.  She is also followed up with Dr. Erlene Quan from urology.  States she was on antibiotics for 10 days which she has finished.  Complains of no BM for 5 to 6 days which is unusual for her.  States she is trying to strain to have a bowel movement and making her hemorrhoids large and bleeding.  Denies fever, cough, chest pain, shortness of breath, nausea, vomiting or urinary retention.       Past Medical History:  Diagnosis Date  . Anxiety   . Depression   . Fatigue   . Heavy periods     There are no problems to display for this patient.   Past Surgical History:  Procedure Laterality Date  . RHINOPLASTY    . TONSILLECTOMY    . TUBAL LIGATION      Prior to Admission medications   Medication Sig Start Date End Date Taking? Authorizing Provider  APPLE CIDER VINEGAR PO Take 2 tablets by mouth in the morning and at bedtime.   Yes [provider]  cyanocobalamin (,VITAMIN B-12,) 1000 MCG/ML injection 1ML q monthly 03/30/19  Yes Shambley, Melody N, CNM  Lactobacillus (AZO COMPLETE FEMININE BALANCE) CAPS Take 1 capsule by mouth daily as needed.   Yes [provider]  ondansetron (ZOFRAN ODT) 4 MG disintegrating tablet Take 1 tablet (4 mg total) by mouth every 8 (eight) hours as needed for nausea  or vomiting. 02/20/20  Yes Earleen Newport, MD  oxyCODONE-acetaminophen (PERCOCET) 5-325 MG tablet Take 1 tablet by mouth every 8 (eight) hours as needed. 02/20/20  Yes Earleen Newport, MD  tamsulosin (FLOMAX) 0.4 MG CAPS capsule Take 1 capsule (0.4 mg total) by mouth daily after breakfast. 02/20/20  Yes Earleen Newport, MD  hydrocortisone (ANUSOL-HC) 2.5 % rectal cream PLACE 1 APPLICATION RECTALLY 2 (TWO) TIMES DAILY. Patient not taking: Reported on 03/03/2020 09/09/19   Joylene Igo, CNM  hydrocortisone-pramoxine (PROCTOFOAM-HC) rectal foam Place 1 applicator rectally 3 (three) times daily. 03/03/20   Paulette Blanch, MD  lactulose (CHRONULAC) 10 GM/15ML solution Take 30 mLs (20 g total) by mouth daily as needed for mild constipation. 03/03/20   Paulette Blanch, MD  oxyCODONE-acetaminophen (PERCOCET/ROXICET) 5-325 MG tablet Take 1 tablet by mouth every 4 (four) hours as needed for severe pain. 03/03/20   Paulette Blanch, MD    Allergies Aspirin  Family History  Problem Relation Age of Onset  . Cancer Mother        ENDOMETRIAL CANCER  . Cancer Paternal Aunt        BREAST  . Heart disease Maternal Grandmother   . Breast cancer Neg Hx   . Ovarian cancer Neg Hx     Social History Social History   Tobacco Use  . Smoking  status: Current Every Day Smoker    Packs/day: 0.25    Years: 10.00    Pack years: 2.50    Types: Cigarettes  . Smokeless tobacco: Never Used  Substance Use Topics  . Alcohol use: Yes    Alcohol/week: 0.0 standard drinks    Comment: occasional   . Drug use: No    Review of Systems  Constitutional: No fever/chills Eyes: No visual changes. ENT: No sore throat. Cardiovascular: Denies chest pain. Respiratory: Denies shortness of breath. Gastrointestinal: Positive for abdominal pain.  No nausea, no vomiting.  No diarrhea.  Positive for constipation.  Positive for hemorrhoidal pain. Genitourinary: Negative for dysuria. Musculoskeletal: Negative for back  pain. Skin: Negative for rash. Neurological: Negative for headaches, focal weakness or numbness.   ____________________________________________   PHYSICAL EXAM:  VITAL SIGNS: ED Triage Vitals  Enc Vitals Group     BP 03/02/20 1812 128/79     Pulse Rate 03/02/20 1812 (!) 134     Resp 03/02/20 1812 (!) 26     Temp 03/02/20 1812 98 F (36.7 C)     Temp Source 03/02/20 1812 Oral     SpO2 03/02/20 1812 98 %     Weight 03/02/20 1809 204 lb (92.5 kg)     Height 03/02/20 1809 5\' 7"  (1.702 m)     Head Circumference --      Peak Flow --      Pain Score 03/02/20 1809 10     Pain Loc --      Pain Edu? --      Excl. in [place]? --     Constitutional: Alert and oriented. Well appearing and in mild acute distress. Eyes: Conjunctivae are normal. PERRL. EOMI. Head: Atraumatic. Nose: No congestion/rhinnorhea. Mouth/Throat: Mucous membranes are moist.  Oropharynx non-erythematous. Neck: No stridor.   Cardiovascular: Normal rate, regular rhythm. Grossly normal heart sounds.  Good peripheral circulation. Respiratory: Normal respiratory effort.  No retractions. Lungs CTAB. Gastrointestinal: Soft with mild diffuse tenderness to palpation without rebound or guarding. No distention. No abdominal bruits. No CVA tenderness. Rectal: Large reducible hemorrhoid which is nonthrombosed and not bleeding. Musculoskeletal: No lower extremity tenderness nor edema.  No joint effusions. Neurologic:  Normal speech and language. No gross focal neurologic deficits are appreciated. No gait instability. Skin:  Skin is warm, dry and intact. No rash noted. Psychiatric: Mood and affect are normal. Speech and behavior are normal.  ____________________________________________   LABS (all labs ordered are listed, but only abnormal results are displayed)  Labs Reviewed  CBC WITH DIFFERENTIAL/PLATELET - Abnormal; Notable for the following components:      Result Value   WBC 19.2 (*)    Platelets 455 (*)    Neutro Abs  17.3 (*)    Abs Immature Granulocytes 0.11 (*)    All other components within normal limits  COMPREHENSIVE METABOLIC PANEL - Abnormal; Notable for the following components:   Sodium 134 (*)    CO2 20 (*)    Glucose, Bld 127 (*)    All other components within normal limits  URINALYSIS, COMPLETE (UACMP) WITH MICROSCOPIC - Abnormal; Notable for the following components:   Color, Urine YELLOW (*)    APPearance HAZY (*)    Ketones, ur 5 (*)    Bacteria, UA FEW (*)    All other components within normal limits  URINE CULTURE  LIPASE, BLOOD  POC URINE PREG, ED  SAMPLE TO BLOOD BANK   ____________________________________________  EKG  None ____________________________________________  RADIOLOGY  ED MD interpretation: Stable right ovarian cystic mass  Official radiology report(s): US PELVIC COMPLETE W TRANSVAGINAL AND TORSION R/O  Result Date: 03/02/2020 CLINICAL DATA:  Pelvic pain EXAM: TRANSABDOMINAL AND TRANSVAGINAL ULTRASOUND OF PELVIS DOPPLER ULTRASOUND OF OVARIES TECHNIQUE: Both transabdominal and transvaginal ultrasound examinations of the pelvis were performed. Transabdominal technique was performed for global imaging of the pelvis including uterus, ovaries, adnexal regions, and pelvic cul-de-sac. It was necessary to proceed with endovaginal exam following the transabdominal exam to visualize the ovaries. Color and duplex Doppler ultrasound was utilized to evaluate blood flow to the ovaries. COMPARISON:  Ultrasound and CT from 02/20/2020 FINDINGS: Uterus Measurements: 8.7 x 3.8 x 4.9 cm. = volume: 85 mL. No fibroids or other mass visualized. Multiple nabothian cysts are seen. Endometrium Thickness: 9.7 mm.  No focal abnormality visualized. Right ovary Measurements: 8.1 x 6.9 x 7.6 cm. = volume: 221 mL. Persistent septated cystic mass is noted within the right ovary stable from the prior exam. It measures approximately 8.6 cm in greatest dimension. Blood flow is noted within the  septations and this is highly suspicious for ovarian neoplasm. Left ovary Not well visualized on today's exam. Pulsed Doppler evaluation of both ovaries demonstrates normal low-resistance arterial and venous waveforms. Other findings Mild free fluid is noted. IMPRESSION: Persistent septated ovarian cystic mass is noted on the right measuring up to 8.6 cm with irregular septations within and vascularity within the septations. This is again suspicious for ovarian neoplasm and gynecologic surgical evaluation is recommended. Left ovary is not well appreciated on today's exam. By history, the patient had a known distal right ureteral stone in this may contribute to the patient's underlying discomfort. Electronically Signed   By: Inez Catalina M.D.   On: 03/02/2020 21:09    ____________________________________________   PROCEDURES  Procedure(s) performed (including Critical Care):  Procedures   ____________________________________________   INITIAL IMPRESSION / ASSESSMENT AND PLAN / ED COURSE  As part of my medical decision making, I reviewed the following data within the [place] notes reviewed and incorporated, Labs reviewed, Old chart reviewed, Radiograph reviewed, Notes from prior ED visits and [place] Controlled Substance Database     Martha Freeman was evaluated in Emergency Department on 03/03/2020 for the symptoms described in the history of present illness. She was evaluated in the context of the global COVID-19 pandemic, which necessitated consideration that the patient might be at risk for infection with the SARS-CoV-2 virus that causes COVID-19. Institutional protocols and algorithms that pertain to the evaluation of patients at risk for COVID-19 are in a state of rapid change based on information released by regulatory bodies including the CDC and federal and state organizations. These policies and algorithms were followed during the patient's care in the ED.      44 year old female with known large right ovarian cystic mass and right ureteral stone, both being worked up by [employer] and urology respectively.  Presenting with abdominal pain and constipation. Differential diagnosis includes, but is not limited to, ovarian cyst, ovarian torsion, acute appendicitis, diverticulitis, urinary tract infection/pyelonephritis, endometriosis, bowel obstruction, colitis, renal colic, gastroenteritis, hernia, fibroids, endometriosis, pregnancy related pain including ectopic pregnancy, etc.  Laboratory results notable for elevation in WBC.  I personally reviewed patient's records including urinalysis and KUB from yesterday done by urology.  She had nitrite positive urine.  I do not see urine culture.  Will repeat urinalysis in the ED.  Discussed with patient who is willing to take an enema for constipation  relief.   Clinical Course as of Mar 04 343  Sat Mar 03, 2020  0226 Awaiting results of UA.  Patient currently sitting on the commode, attempting to have a BM.   [JS]  [id number] Patient had satisfactory bowel movement.  She is smiling and joking.  Reexamined abdomen which is now benign.  Updated her on UA result.  Have added urine culture.  Discussed noted leukocytosis which is elevated from prior.  No indication of active infection on today's exam.  No clinical evidence to repeat CT scan.  Will discharge home with prescription for Lactulose to use as needed, Proctofoam and additional Percocet.  Strict return precautions given.  Patient verbalizes understanding and agrees with plan of care.   [JS]    Clinical Course User Index [JS] Paulette Blanch, MD     ____________________________________________   FINAL CLINICAL IMPRESSION(S) / ED DIAGNOSES  Final diagnoses:  Pelvic pain  Constipation, unspecified constipation type  Generalized abdominal pain  Hemorrhoids, unspecified hemorrhoid type     ED Discharge Orders         Ordered    lactulose (CHRONULAC) 10 GM/15ML  solution  Daily PRN     03/03/20 0258    oxyCODONE-acetaminophen (PERCOCET/ROXICET) 5-325 MG tablet  Every 4 hours PRN     03/03/20 0258    hydrocortisone-pramoxine (PROCTOFOAM-HC) rectal foam  3 times daily     03/03/20 0258           Note:  This document was prepared using Dragon voice recognition software and may include unintentional dictation errors.   Paulette Blanch, MD 03/03/20 [phone]

## 2020-03-02 NOTE — ED Notes (Signed)
Pt reports diagnosed with kidney stone 02/20/2020 reports feel like she has not passed kidney stone, pt reports has hemorrhoids and pain has increased since taking pain medication, pt reports has not had a BM for about 4 days. Pt talks in complete sentences, moaning, reports pain has increased medications has not helped.

## 2020-03-02 NOTE — ED Triage Notes (Signed)
Pt presents to ED via POV with c/o RLQ/pelvic pain. Pt states dx with ovarian cyst "the size of a peach" and a kidney stone, pt also states she became constipated from taking pain medication and "now [her] hemorrhoids are bleeding". Pt denies vaginal bleeding, presents to triage tearful and appears uncomfortable, unable to sit still.   Pt denies vaginal bleeding at this time. Pt states took Percocet around lunch time without relief. Pt noted to be pale upon arrival to triage.

## 2020-03-03 LAB — URINALYSIS, COMPLETE (UACMP) WITH MICROSCOPIC
Bilirubin Urine: NEGATIVE
Glucose, UA: NEGATIVE mg/dL
Hgb urine dipstick: NEGATIVE
Ketones, ur: 5 mg/dL — AB
Leukocytes,Ua: NEGATIVE
Nitrite: NEGATIVE
Protein, ur: NEGATIVE mg/dL
Specific Gravity, Urine: 1.009 (ref 1.005–1.030)
pH: 6 (ref 5.0–8.0)

## 2020-03-03 MED ORDER — OXYCODONE-ACETAMINOPHEN 5-325 MG PO TABS: 1.0000 | ORAL_TABLET | ORAL | 0 refills | Status: DC | PRN

## 2020-03-03 MED ORDER — LACTULOSE 10 GM/15ML PO SOLN: 20.0000 g | Freq: Every day | ORAL | 0 refills | Status: AC | PRN

## 2020-03-03 MED ORDER — HYDROCORT-PRAMOXINE (PERIANAL) 1-1 % EX FOAM: 1.0000 | Freq: Three times a day (TID) | CUTANEOUS | 0 refills | Status: DC

## 2020-03-03 NOTE — ED Notes (Signed)
Dr. Sung at bedside.  

## 2020-03-03 NOTE — ED Notes (Signed)
Pt ambulatory to restroom

## 2020-03-03 NOTE — ED Notes (Signed)
Reviewed discharge instructions, follow-up care, and prescriptions with patient. Patient verbalized understanding of all information reviewed. Patient stable, with no distress noted at this time.    

## 2020-03-03 NOTE — Discharge Instructions (Signed)
1.  You may take lactulose as needed for bowel movements. 2.  You may apply Proctofoam to hemorrhoid as needed. 3.  Take Percocet as needed for severe pain. 4.  I recommend the following over-the-counter medications to help regulate daily bowel movements: MiraLAX Stool softener such as Colace Fiber Plenty of fluids 5.  Return to the ER for worsening symptoms, persistent vomiting, difficulty breathing or other concerns.

## 2020-03-04 LAB — URINE CULTURE: Culture: 20000 — AB

## 2020-03-05 ENCOUNTER — Telehealth: Payer: Self-pay | Admitting: Obstetrics and Gynecology

## 2020-03-05 ENCOUNTER — Telehealth: Payer: Self-pay | Admitting: *Deleted

## 2020-03-05 NOTE — Telephone Encounter (Signed)
Patient has [employer] family planning that only covers pregnancy and birth control. She is a self pay patient in our office.   Martha Freeman

## 2020-03-05 NOTE — Telephone Encounter (Signed)
I am shocked that insurance will cover renal ultrasound.  The does not make any sense.  Please have Sharyn Lull call and see was going on here.  That is really  what she needs to ensure that she has passed the stone.  Hollice Espy, MD

## 2020-03-05 NOTE — Telephone Encounter (Addendum)
Called patient to give results, stated she was in the ER due to constipation on 03/02/20. Symptoms have improved some, still having some left abdominal pain. She has a follow up with Dr. Amalia Hailey tomorrow. She was contacted by her insurance company that will not cover her RUS. Please advise.    ----- Message from Hollice Espy, MD sent at 03/03/2020  1:49 PM EDT ----- As suspected, not able to see stone on KUB.  Will call with renal ultrasound results.    Hollice Espy, MD

## 2020-03-05 NOTE — Telephone Encounter (Signed)
Spoke with patient and let her know that Dr. Amalia Hailey is not in the clinic today. She has plenty of pain medication that the ED gave her. She is not taking it due to medication causing constipation. She is trying to just use Tylenol. Patient already has appointment scheduled for tomorrow.

## 2020-03-05 NOTE — Progress Notes (Signed)
ED Antimicrobial Stewardship Positive Culture Follow Up   Martha Freeman is an 44 y.o. female who presented to [facility] on 03/02/2020 with a chief complaint of No chief complaint on file.   Recent Results (from the past 720 hour(s))  Urine culture     Status: Abnormal   Collection Time: 03/03/20  1:43 AM   Specimen: Urine, Random  Result Value Ref Range Status   Specimen Description   Final    URINE, RANDOM Performed at [facility], [address], [place], [place] [address]    Special Requests   Final    NONE Performed at [facility], [place], [place] [address]    Culture 20,000 COLONIES/mL YEAST (A)  Final   Report Status 03/04/2020 FINAL  Final    Messaged Dr. Hollice Espy just as an fyi.  (Dr. Erlene Quan left note on chart today 03/05/20) Suggested that this is usually not treated. May want to assess for vaginal yeast infection?  MD agreed- do not treat Urine cx.   Delane Stalling A 03/05/2020, 2:40 PM Clinical Pharmacist

## 2020-03-05 NOTE — Telephone Encounter (Signed)
Patient called in saying the ED told her to call [facility] first thing in the morning to inform Dr. Amalia Hailey that her cyst has grown and that they found more. Patient said she is in discomfort and having abdominal cramping.

## 2020-03-06 ENCOUNTER — Other Ambulatory Visit: Payer: Self-pay

## 2020-03-06 ENCOUNTER — Other Ambulatory Visit: Payer: Self-pay | Admitting: Obstetrics and Gynecology

## 2020-03-06 ENCOUNTER — Encounter: Payer: Self-pay | Admitting: Obstetrics and Gynecology

## 2020-03-06 ENCOUNTER — Ambulatory Visit (INDEPENDENT_AMBULATORY_CARE_PROVIDER_SITE_OTHER): Payer: Medicaid Other | Admitting: Obstetrics and Gynecology

## 2020-03-06 VITALS — BP 137/90 | HR 125 | Ht 67.0 in | Wt 201.0 lb

## 2020-03-06 DIAGNOSIS — N83299 Other ovarian cyst, unspecified side: Secondary | ICD-10-CM

## 2020-03-06 NOTE — Progress Notes (Signed)
HPI:      [name] is a 44 y.o. [id number] who LMP was [name] was 02/13/2020.  Subjective:   She presents today for follow-up of complex ovarian mass. She was recently seen in the emergency department again for pelvic colicky pain.  She has been consulted to urology for previously noted ureteral stone.  No stones seen at this time.  Repeat ultrasound showed a stable complex right ovarian cyst.    Hx: The following portions of the patient's history were reviewed and updated as appropriate:             She  has a past medical history of Anxiety, Depression, Fatigue, and Heavy periods. She does not have a problem list on file. She  has a past surgical history that includes Rhinoplasty; Tubal ligation; and Tonsillectomy. Her family history includes Cancer in her mother and paternal aunt; Heart disease in her maternal grandmother. She  reports that she has been smoking cigarettes. She has a 2.50 pack-year smoking history. She has never used smokeless tobacco. She reports current alcohol use. She reports that she does not use drugs. She has a current medication list which includes the following prescription(s): apple cider vinegar, cyanocobalamin, hydrocortisone, hydrocortisone-pramoxine, azo complete feminine balance, lactulose, ondansetron, oxycodone-acetaminophen, oxycodone-acetaminophen, and tamsulosin. She is allergic to aspirin.       Review of Systems:  Review of Systems  Constitutional: Denied constitutional symptoms, night sweats, recent illness, fatigue, fever, insomnia and weight loss.  Eyes: Denied eye symptoms, eye pain, photophobia, vision change and visual disturbance.  Ears/Nose/Throat/Neck: Denied ear, nose, throat or neck symptoms, hearing loss, nasal discharge, sinus congestion and sore throat.  Cardiovascular: Denied cardiovascular symptoms, arrhythmia, chest pain/pressure, edema, exercise intolerance, orthopnea and palpitations.  Respiratory:  Denied pulmonary symptoms, asthma, pleuritic pain, productive sputum, cough, dyspnea and wheezing.  Gastrointestinal: Denied, gastro-esophageal reflux, melena, nausea and vomiting.  Genitourinary: See HPI for additional information.  Musculoskeletal: Denied musculoskeletal symptoms, stiffness, swelling, muscle weakness and myalgia.  Dermatologic: Denied dermatology symptoms, rash and scar.  Neurologic: Denied neurology symptoms, dizziness, headache, neck pain and syncope.  Psychiatric: Denied psychiatric symptoms, anxiety and depression.  Endocrine: Denied endocrine symptoms including hot flashes and night sweats.   Meds:   Current Outpatient Medications on File Prior to Visit  Medication Sig Dispense Refill  . APPLE CIDER VINEGAR PO Take 2 tablets by mouth in the morning and at bedtime.    . cyanocobalamin (,VITAMIN B-12,) 1000 MCG/ML injection 1ML q monthly 1 mL 6  . hydrocortisone (ANUSOL-HC) 2.5 % rectal cream PLACE 1 APPLICATION RECTALLY 2 (TWO) TIMES DAILY. (Patient not taking: Reported on 03/03/2020) 28.35 g 1  . hydrocortisone-pramoxine (PROCTOFOAM-HC) rectal foam Place 1 applicator rectally 3 (three) times daily. 10 g 0  . Lactobacillus (AZO COMPLETE FEMININE BALANCE) CAPS Take 1 capsule by mouth daily as needed.    . lactulose (CHRONULAC) 10 GM/15ML solution Take 30 mLs (20 g total) by mouth daily as needed for mild constipation. 120 mL 0  . ondansetron (ZOFRAN ODT) 4 MG disintegrating tablet Take 1 tablet (4 mg total) by mouth every 8 (eight) hours as needed for nausea or vomiting. 20 tablet 0  . oxyCODONE-acetaminophen (PERCOCET) 5-325 MG tablet Take 1 tablet by mouth every 8 (eight) hours as needed. 20 tablet 0  . oxyCODONE-acetaminophen (PERCOCET/ROXICET) 5-325 MG tablet Take 1 tablet by mouth every 4 (four) hours as needed for severe pain. 15 tablet 0  . tamsulosin (FLOMAX) 0.4 MG CAPS capsule  Take 1 capsule (0.4 mg total) by mouth daily after breakfast. 30 capsule 0   No  current facility-administered medications on file prior to visit.    Objective:     Vitals:   03/06/20 1404  BP: 137/90  Pulse: (!) 125                Assessment:    [id number] There are no problems to display for this patient.    1. Complex ovarian cyst     Roma score elevated for premenopausal women   Plan:            1.  I have discussed complex ovarian cysts in detail with the patient.  Ovarian cancer was discussed as a possibility.  Roma scoring was reviewed and the rationale for referral to GYN oncology discussed.  All patient's questions were answered. Refer to GYN oncology for further work-up possible surgery if necessary. Orders No orders of the defined types were placed in this encounter.   No orders of the defined types were placed in this encounter.     F/U  Return for Referral to GYN oncology. I spent 25 minutes involved in the care of this patient preparing to see the patient by obtaining and reviewing her medical history (including labs, imaging tests and prior procedures), documenting clinical information in the electronic health record (EHR), counseling and coordinating care plans, writing and sending prescriptions, ordering tests or procedures and directly communicating with the patient by discussing pertinent items from her history and physical exam as well as detailing my assessment and plan as noted above so that she has an informed understanding.  All of her questions were answered.  Finis Bud, M.D. 03/06/2020 2:38 PM

## 2020-03-07 ENCOUNTER — Inpatient Hospital Stay: Payer: Self-pay | Attending: Obstetrics and Gynecology | Admitting: Obstetrics and Gynecology

## 2020-03-07 ENCOUNTER — Encounter: Payer: Self-pay | Admitting: Obstetrics and Gynecology

## 2020-03-07 ENCOUNTER — Telehealth: Payer: Self-pay

## 2020-03-07 DIAGNOSIS — F419 Anxiety disorder, unspecified: Secondary | ICD-10-CM | POA: Insufficient documentation

## 2020-03-07 DIAGNOSIS — R112 Nausea with vomiting, unspecified: Secondary | ICD-10-CM | POA: Insufficient documentation

## 2020-03-07 DIAGNOSIS — K589 Irritable bowel syndrome without diarrhea: Secondary | ICD-10-CM | POA: Insufficient documentation

## 2020-03-07 DIAGNOSIS — N83201 Unspecified ovarian cyst, right side: Secondary | ICD-10-CM | POA: Insufficient documentation

## 2020-03-07 DIAGNOSIS — N83209 Unspecified ovarian cyst, unspecified side: Secondary | ICD-10-CM | POA: Insufficient documentation

## 2020-03-07 DIAGNOSIS — F329 Major depressive disorder, single episode, unspecified: Secondary | ICD-10-CM | POA: Insufficient documentation

## 2020-03-07 DIAGNOSIS — R1030 Lower abdominal pain, unspecified: Secondary | ICD-10-CM | POA: Insufficient documentation

## 2020-03-07 DIAGNOSIS — F17210 Nicotine dependence, cigarettes, uncomplicated: Secondary | ICD-10-CM | POA: Insufficient documentation

## 2020-03-07 DIAGNOSIS — R102 Pelvic and perineal pain: Secondary | ICD-10-CM | POA: Insufficient documentation

## 2020-03-07 NOTE — Progress Notes (Signed)
Gynecologic Oncology Consult Visit   Referring Provider: Jeannie Fend, MD  Chief Concern: ovarian mass, pelvic pain  Subjective:  Martha Freeman is a 44 y.o. P2 female who is seen in consultation from Dr. Amalia Hailey for ovarian mass, pelvic pain.  She has a history of kidney stones and right ovarian mass.  Also has IBD with constipation and large hemorrhoids.   Seen at [facility] ED 02/20/20 History of anxiety, depression, IBS, interstitial cystitis and presents to the ED for lower abdominal pain that started 4 days ago.  Pain is 10 out of 10.  She has had nausea vomiting but denies any diarrhea.  She is also seeing some hematuria.  CT of the abdomen pelvis IMPRESSION:  1. 3 x 5 mm distal right ureteral calculus resulting in mild  right-sided hydronephrosis and ureteral distension.  2. Septated cystic adnexal lesion on the right measures 6.7 x 5.5 x  8.0 cm previously 5.0 x 3.7 cm. calcifications are noted. Question  of septal thickening and perhaps mural nodularity along the superior  margin. This was present in 2014 but has enlarged since that time.  Low-grade epithelial neoplasm is not excluded. Pelvic sonogram on  follow-up may be helpful.  3. Possible sludge in the gallbladder without pericholecystic  stranding.  4. Unchanged mild haziness at the root of the small bowel mesentery  and associated small lymph nodes, potentially reflecting sequela of  previous inflammation. Stable since 2014 and not associated with  retroperitoneal adenopathy or splenic enlargement.  IMPRESSION:  Unremarkable uterus, endometrial complex, and LEFT ovary.  Complex cystic mass of the RIGHT ovary 8.2 x 5.2 x 7.6 cm containing  thick irregular septations and areas of soft tissue thickening/mural  nodularity peripherally. 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   Seen by Dr Erlene Quan in Urology 4/15 Highly suspect interval passage due to clear urine today and lack of  pain Encouraged to continue water intake  Needs renal US to assess for passage of stone, but denied by insurance.  [place] 4/16 Measurements: 8.1 x 6.9 x 7.6 cm. = volume: 221 mL. Persistent septated cystic mass is noted within the right ovary stable from the prior exam. It measures approximately 8.6 cm in greatest dimension. Blood flow is noted within the septations and this is highly suspicious for ovarian neoplasm.  Mass was present on 2014 CT scan due to kidney stones This measured 5 x 4.2 cm.   Seen by Dr Amalia Hailey in [place].  She was found to have ureteral calculi and a UTI with hematuria in the ED.  She continues to take antibiotics and Azo for her UTI. She has been consulted to urology for ureteral stone. Repeat ultrasound showed a stable complex 8 cm right ovarian cyst. ROMA 1.26 elevated. CA125 = 8.2, HE4 = 64.8.  She has very painful periods with dysmenorrhea.  No bleeding in between.  Problem List: Patient Active Problem List   Diagnosis Date Noted  . Ovarian cyst 03/07/2020    Past Medical History: Past Medical History:  Diagnosis Date  . Anxiety   . Depression   . Fatigue   . Heavy periods     Past Surgical History: Past Surgical History:  Procedure Laterality Date  . RHINOPLASTY    . TONSILLECTOMY    . TUBAL LIGATION        OB History:  OB History  Gravida Para Term Preterm AB Living  3 2 2   1 2   SAB TAB  Ectopic Multiple Live Births  1       2    # Outcome Date GA Lbr Len/2nd Weight Sex Delivery Anes PTL Lv  3 Term 04/19/04   8 lb (3.629 kg) F CS-LTranv  N LIV  2 SAB 2004          1 Term 11/16/01   5 lb 15 oz (2.693 kg) M CS-LTranv EPI N LIV    Family History: Family History  Problem Relation Age of Onset  . Cancer Mother        ENDOMETRIAL CANCER  . Cancer Paternal Aunt        BREAST  . Heart disease Maternal Grandmother   . Breast cancer Neg Hx   . Ovarian cancer Neg Hx     Social History: Social History   Socioeconomic History  . Marital  status: Married    Spouse name: Not on file  . Number of children: Not on file  . Years of education: Not on file  . Highest education level: Not on file  Occupational History  . Not on file  Tobacco Use  . Smoking status: Current Every Day Smoker    Packs/day: 0.25    Years: 10.00    Pack years: 2.50    Types: Cigarettes  . Smokeless tobacco: Never Used  Substance and Sexual Activity  . Alcohol use: Yes    Alcohol/week: 0.0 standard drinks    Comment: occasional   . Drug use: No  . Sexual activity: Yes    Partners: Female    Birth control/protection: None    Comment: same sex  Other Topics Concern  . Not on file  Social History Narrative  . Not on file   Social Determinants of Health   Financial Resource Strain:   . Difficulty of Paying Living Expenses:   Food Insecurity:   . Worried About Charity fundraiser in the Last Year:   . Arboriculturist in the Last Year:   Transportation Needs:   . Film/video editor (Medical):   Marland Kitchen Lack of Transportation (Non-Medical):   Physical Activity:   . Days of Exercise per Week:   . Minutes of Exercise per Session:   Stress:   . Feeling of Stress :   Social Connections:   . Frequency of Communication with Friends and Family:   . Frequency of Social Gatherings with Friends and Family:   . Attends Religious Services:   . Active Member of Clubs or Organizations:   . Attends Archivist Meetings:   Marland Kitchen Marital Status:   Intimate Partner Violence:   . Fear of Current or Ex-Partner:   . Emotionally Abused:   Marland Kitchen Physically Abused:   . Sexually Abused:     Allergies: Allergies  Allergen Reactions  . Aspirin     "I don't know what kind of reaction I had. It was when I was little."    Current Medications: Current Outpatient Medications  Medication Sig Dispense Refill  . APPLE CIDER VINEGAR PO Take 2 tablets by mouth in the morning and at bedtime.    . cyanocobalamin (,VITAMIN B-12,) 1000 MCG/ML injection 1ML q  monthly 1 mL 6  . hydrocortisone (ANUSOL-HC) 2.5 % rectal cream PLACE 1 APPLICATION RECTALLY 2 (TWO) TIMES DAILY. (Patient not taking: Reported on 03/03/2020) 28.35 g 1  . hydrocortisone-pramoxine (PROCTOFOAM-HC) rectal foam Place 1 applicator rectally 3 (three) times daily. 10 g 0  . Lactobacillus (AZO COMPLETE FEMININE BALANCE) CAPS Take  1 capsule by mouth daily as needed.    . lactulose (CHRONULAC) 10 GM/15ML solution Take 30 mLs (20 g total) by mouth daily as needed for mild constipation. 120 mL 0  . ondansetron (ZOFRAN ODT) 4 MG disintegrating tablet Take 1 tablet (4 mg total) by mouth every 8 (eight) hours as needed for nausea or vomiting. 20 tablet 0  . oxyCODONE-acetaminophen (PERCOCET) 5-325 MG tablet Take 1 tablet by mouth every 8 (eight) hours as needed. 20 tablet 0  . oxyCODONE-acetaminophen (PERCOCET/ROXICET) 5-325 MG tablet Take 1 tablet by mouth every 4 (four) hours as needed for severe pain. 15 tablet 0  . tamsulosin (FLOMAX) 0.4 MG CAPS capsule Take 1 capsule (0.4 mg total) by mouth daily after breakfast. 30 capsule 0   No current facility-administered medications for this visit.    Review of Systems General: negative for, fevers, chills, changes in weight or appetite Skin: negative for changes in color, texture, moles or lesions Eyes: negative for, changes in vision, pain, diplopia HEENT: negative for, change in hearing, pain, discharge, tinnitus, vertigo, voice changes, sore throat, neck masses Breasts: negative for breast lumps Pulmonary: negative for, dyspnea, orthopnea, productive cough Cardiac: negative for, palpitations, syncope, pain, discomfort, pressure Gastrointestinal: negative for, dysphagia, nausea, vomiting, jaundice, pain, constipation, diarrhea, hematemesis, hematochezia Musculoskeletal: negative for, pain, stiffness, swelling, range of motion limitation Hematology: No coagulation disorder, No bruising, negative for, easy bruising, bleeding   Objective:   Physical Examination:   Vitals:   03/07/20 1341  BP: (!) 129/99  Pulse: (!) 109  Temp: 98.1 F (36.7 C)  Resp: 20  Weight: 196 lb (88.9 kg)  SpO2: 100%  TempSrc: Tympanic  BMI (Calculated): 30.69   LMP 02/13/2020 Comment: neg preg test 02/20/20   ECOG Performance Status: 1 - Symptomatic but completely ambulatory  General appearance: alert, cooperative and appears stated age HEENT:PERRLA, neck supple with midline trachea and thyroid without masses Lymph node survey: non-palpable, axillary, inguinal, supraclavicular Cardiovascular: regular rate and rhythm, no murmurs or gallops Respiratory: normal air entry, lungs clear to auscultation Abdomen: no hernias. Lower abdominal tenderness, no masses. Back: inspection of back is normal Extremities: extremities normal, atraumatic, no cyanosis or edema Skin exam - normal coloration and turgor, no rashes, no suspicious skin lesions noted. Neurological exam reveals alert, oriented, normal speech, no focal findings or movement disorder noted.  Pelvic: exam chaperoned by nurse;  Vulva: normal appearing vulva with no masses, tenderness or lesions; Vagina: normal vagina; Adnexa: normal adnexa in size, nontender and no masses; Uterus: uterus is normal size, shape, consistency and nontender; Cervix: anteverted; Rectal: not indicated. Large prolapsing hemorrhoid.   Lab Review Lab Results  Component Value Date   WBC 19.2 (H) 03/02/2020   HGB 12.5 03/02/2020   HCT 37.9 03/02/2020   MCV 81.2 03/02/2020   PLT 455 (H) 03/02/2020      Chemistry      Component Value Date/Time   NA 134 (L) 03/02/2020 1824   NA 143 06/03/2018 0848   K 3.8 03/02/2020 1824   CL 103 03/02/2020 1824   CO2 20 (L) 03/02/2020 1824   BUN 12 03/02/2020 1824   BUN 12 06/03/2018 0848   CREATININE 0.94 03/02/2020 1824      Component Value Date/Time   CALCIUM 9.6 03/02/2020 1824   ALKPHOS 55 03/02/2020 1824   AST 18 03/02/2020 1824   ALT 21 03/02/2020 1824   BILITOT  0.6 03/02/2020 1824   BILITOT 0.3 06/03/2018 0848  Assessment:  DANI FRAISE is a 44 y.o. female diagnosed with pelvic pain and 8 cm right ovarian mass with septation and some blood flow and possible mural nodularity.  Slightly elevated ROMA.  CA125 normal.  HE4 slightly elevated, but can be falsely elevated with kidney issues.  Mass was present in 2014 and was 5 cm, so this is not a new neoplasm, but has grown slightly.  She has ongoing dysmenorrhea.  Also has IBD, Constipation and large external hemorrhoid (slept in bathtub with warm water several days this week).  And history of renal stones with recent episode of right distal ureteral stone with hematuria, UTI and being followed by Dr Erlene Quan in Urology.      Medical co-morbidities complicating care: anxiety, depression.  Plan:   Problem List Items Addressed This Visit      Endocrine   Ovarian cyst      We discussed options for management including follow up with Dr Erlene Quan after renal US to confirm passage of stone.    Suggest referral to Dr. Kem Parkinson at [facility] to consider surgical management of large hemorrhoid.  Ovarian mass unlikely to be cancer and not likely a major cause of her pain, but in view of size and dysmenorrhea TLH, RSO would be reasonable.  Discussed this with the patient and with Dr. Amalia Hailey.  She will return to him after seeing Dr Erlene Quan and Dr Sheryn Bison.    The patient's diagnosis, an outline of the further diagnostic and laboratory studies which will be required, the recommendation, and alternatives were discussed.  All questions were answered to the patient's satisfaction.  A total of 40 minutes were spent with the patient/family today; 60 % was spent in education, counseling and coordination of care for pelvic pain, right ovarian mass.    Mellody Drown, MD  CC:  Center, [facility] [address],  [place] [address] [phone]

## 2020-03-07 NOTE — Progress Notes (Signed)
Pt here as new pt.with  Cystitis, kidney stones, hemorrhoids IBS and vaginal pain, cyst-ovarian. She takes lactulose for constipation and got foam for her hemorrhoids. Pt states she has pain from vaginalarea and rectal area and not taking pain meds prescribed unless she ahs to because it makes her more constipated-rating 5. Will take tylenol and the pain pill only if it gets really severe

## 2020-03-07 NOTE — Telephone Encounter (Signed)
Referral received from Dr. Amalia Hailey. Can be seen today in gyn onc. Voicemail left to return call for scheduling.

## 2020-03-07 NOTE — Progress Notes (Signed)
Referral sent to Dr. Sheryn Bison at [facility] for hemorrhoid per Dr. Fransisca Connors.

## 2020-03-07 NOTE — Telephone Encounter (Signed)
Left patient a mess to call back to the office and notify her that the renal u/s is the only way to ensure the stone has passed and swelling is resolved

## 2020-03-08 NOTE — Telephone Encounter (Signed)
Patient is agreeable to go ahead with RUS as self pay

## 2020-03-08 NOTE — Telephone Encounter (Signed)
They called her on the 19th to schedule and she has not called them back. She can either call them back at [phone] or wait for them to call her back. They will only call her 3 times after that they do not call again.

## 2020-03-10 ENCOUNTER — Encounter: Payer: Self-pay | Admitting: Obstetrics and Gynecology

## 2020-03-13 ENCOUNTER — Telehealth: Payer: Self-pay

## 2020-03-13 NOTE — Telephone Encounter (Signed)
Message to sent to Dr. Fransisca Connors regarding referral to Dr. Sheryn Bison. Appointment was made for June 24. Requesting to be advised due to long time frame for this appointment.

## 2020-03-15 ENCOUNTER — Ambulatory Visit
Admission: RE | Admit: 2020-03-15 | Discharge: 2020-03-15 | Disposition: A | Discharge status: Home / Self Care | Payer: Self-pay | Source: Ambulatory Visit | Attending: Urology | Admitting: Urology

## 2020-03-15 ENCOUNTER — Other Ambulatory Visit: Payer: Self-pay

## 2020-03-15 DIAGNOSIS — N200 Calculus of kidney: Secondary | ICD-10-CM | POA: Insufficient documentation

## 2020-03-16 ENCOUNTER — Telehealth: Payer: Self-pay | Admitting: *Deleted

## 2020-03-16 ENCOUNTER — Telehealth: Payer: Self-pay

## 2020-03-16 NOTE — Telephone Encounter (Addendum)
Patient informed, voiced understanding.   ----- Message from Hollice Espy, MD sent at 03/16/2020  8:27 AM EDT ----- RUS totally normal.  She has passed stone based on these results.  Great news.  Hollice Espy, MD

## 2020-03-16 NOTE — Telephone Encounter (Signed)
Received call from Martha Freeman. She has passed her kidney stone per her [place]. Appointment with Dr. Sheryn Bison is in June. I have called and put in request to see if Dr. Sheryn Bison can see her sooner. She states she does not mind going to see Dr. Sheryn Bison but would like to have her surgery for adnexal mass sooner than that. She needs referral back to Dr. Amalia Hailey. Reports hemorrhoid pain is better at this time. I have sent a message to Dr. Fransisca Connors to ask if she can go back and see Dr. Amalia Hailey regarding surgery.

## 2020-03-19 ENCOUNTER — Other Ambulatory Visit: Payer: Self-pay

## 2020-03-19 DIAGNOSIS — N83209 Unspecified ovarian cyst, unspecified side: Secondary | ICD-10-CM

## 2020-03-19 NOTE — Progress Notes (Signed)
Dr. Fransisca Connors is okay with Ms. Lennert going back to Dr. Amalia Hailey prior to seeing Dr. Sheryn Bison. Referral sent back to Dr. Amalia Hailey.

## 2020-03-27 ENCOUNTER — Ambulatory Visit: Payer: Medicaid Other

## 2020-03-30 ENCOUNTER — Ambulatory Visit: Payer: Medicaid Other

## 2020-03-30 ENCOUNTER — Encounter: Payer: Self-pay | Admitting: Obstetrics and Gynecology

## 2020-03-30 ENCOUNTER — Ambulatory Visit: Payer: Medicaid Other | Attending: Internal Medicine

## 2020-03-30 ENCOUNTER — Other Ambulatory Visit: Payer: Self-pay

## 2020-03-30 ENCOUNTER — Ambulatory Visit (INDEPENDENT_AMBULATORY_CARE_PROVIDER_SITE_OTHER): Payer: Self-pay | Admitting: Obstetrics and Gynecology

## 2020-03-30 VITALS — BP 137/83 | HR 90 | Ht 67.0 in | Wt 201.8 lb

## 2020-03-30 DIAGNOSIS — Z23 Encounter for immunization: Secondary | ICD-10-CM

## 2020-03-30 DIAGNOSIS — N83299 Other ovarian cyst, unspecified side: Secondary | ICD-10-CM

## 2020-03-30 NOTE — Progress Notes (Signed)
HPI:      [name] is a 44 y.o. [id number] who LMP was [name] was 03/09/2020.  Subjective:   She presents today to discuss surgery for complex ovarian neoplasm.  Patient has been to see Dr. Fransisca Connors regarding the possibility of ovarian cancer and despite her slightly elevated Roma score and the complex nature of her ovarian cyst he believes it is very low risk for ovarian cancer.  Being a neoplasm he does recommend its removal and has additionally suggested hysterectomy at the time.  Patient has passed her kidney stone and no longer has any further issues Patient is not currently having any issues with her hemorrhoid and " when I have issues I know how to deal with them".  She does not want anything done at this time.  She does have a future appointment with GI to further evaluate this hemorrhoid.  She would rather have her surgery first before further hemorrhoid work-up.    Hx: The following portions of the patient's history were reviewed and updated as appropriate:             She  has a past medical history of Anxiety, Depression, Fatigue, Heavy periods, Hemorrhoids, IBS (irritable bowel syndrome), Kidney stones, and Ovarian cyst. She does not have any pertinent problems on file. She  has a past surgical history that includes Rhinoplasty; Tubal ligation; and Tonsillectomy. Her family history includes Cancer in her mother and paternal aunt; Heart disease in her maternal grandmother. She  reports that she has been smoking cigarettes. She has a 2.50 pack-year smoking history. She has never used smokeless tobacco. She reports current alcohol use. She reports that she does not use drugs. She has a current medication list which includes the following prescription(s): apple cider vinegar, cyanocobalamin, hydrocortisone, hydrocortisone-pramoxine, azo complete feminine balance, lactulose, ondansetron, tamsulosin, oxycodone-acetaminophen, and oxycodone-acetaminophen. She  is allergic to aspirin.       Review of Systems:  Review of Systems  Constitutional: Denied constitutional symptoms, night sweats, recent illness, fatigue, fever, insomnia and weight loss.  Eyes: Denied eye symptoms, eye pain, photophobia, vision change and visual disturbance.  Ears/Nose/Throat/Neck: Denied ear, nose, throat or neck symptoms, hearing loss, nasal discharge, sinus congestion and sore throat.  Cardiovascular: Denied cardiovascular symptoms, arrhythmia, chest pain/pressure, edema, exercise intolerance, orthopnea and palpitations.  Respiratory: Denied pulmonary symptoms, asthma, pleuritic pain, productive sputum, cough, dyspnea and wheezing.  Gastrointestinal: Denied, gastro-esophageal reflux, melena, nausea and vomiting.  Genitourinary: Denied genitourinary symptoms including symptomatic vaginal discharge, pelvic relaxation issues, and urinary complaints.  Musculoskeletal: Denied musculoskeletal symptoms, stiffness, swelling, muscle weakness and myalgia.  Dermatologic: Denied dermatology symptoms, rash and scar.  Neurologic: Denied neurology symptoms, dizziness, headache, neck pain and syncope.  Psychiatric: Denied psychiatric symptoms, anxiety and depression.  Endocrine: Denied endocrine symptoms including hot flashes and night sweats.   Meds:   Current Outpatient Medications on File Prior to Visit  Medication Sig Dispense Refill  . APPLE CIDER VINEGAR PO Take 2 tablets by mouth in the morning and at bedtime.    . cyanocobalamin (,VITAMIN B-12,) 1000 MCG/ML injection 1ML q monthly 1 mL 6  . hydrocortisone (ANUSOL-HC) 2.5 % rectal cream PLACE 1 APPLICATION RECTALLY 2 (TWO) TIMES DAILY. 28.35 g 1  . hydrocortisone-pramoxine (PROCTOFOAM-HC) rectal foam Place 1 applicator rectally 3 (three) times daily. 10 g 0  . Lactobacillus (AZO COMPLETE FEMININE BALANCE) CAPS Take 1 capsule by mouth daily as needed.    . lactulose (CHRONULAC) 10 GM/15ML solution Take  30 mLs (20 g total) by  mouth daily as needed for mild constipation. 120 mL 0  . ondansetron (ZOFRAN ODT) 4 MG disintegrating tablet Take 1 tablet (4 mg total) by mouth every 8 (eight) hours as needed for nausea or vomiting. 20 tablet 0  . tamsulosin (FLOMAX) 0.4 MG CAPS capsule Take 1 capsule (0.4 mg total) by mouth daily after breakfast. 30 capsule 0  . oxyCODONE-acetaminophen (PERCOCET) 5-325 MG tablet Take 1 tablet by mouth every 8 (eight) hours as needed. (Patient not taking: Reported on 03/30/2020) 20 tablet 0  . oxyCODONE-acetaminophen (PERCOCET/ROXICET) 5-325 MG tablet Take 1 tablet by mouth every 4 (four) hours as needed for severe pain. (Patient not taking: Reported on 03/30/2020) 15 tablet 0   No current facility-administered medications on file prior to visit.    Objective:     Vitals:   03/30/20 0951  BP: 137/83  Pulse: 90           Assessment:    G3P2012 Patient Active Problem List   Diagnosis Date Noted  . Ovarian cyst 03/07/2020     1. Complex ovarian cyst     Patient has been seen in consult by Dr. Fransisca Connors and he believes this is a benign neoplasm.  Patient would like to have hysterectomy and likely BSO.   Plan:            1.  We will plan preop visit for LAVH BSO.  We have discussed ovarian cystectomy-patient declined this.  We have discussed the neoplasm versus ovarian cancer and she understands ovarian cancer remains a low risk.  We have discussed leaving 1 ovary for hormonal support but patient states she is already having hot flashes and thinks she wants both ovaries out. Orders No orders of the defined types were placed in this encounter.   No orders of the defined types were placed in this encounter.     F/U  Return in about 2 weeks (around 04/13/2020). I spent 23 minutes involved in the care of this patient preparing to see the patient by obtaining and reviewing her medical history (including labs, imaging tests and prior procedures), documenting clinical information in  the electronic health record (EHR), counseling and coordinating care plans, writing and sending prescriptions, ordering tests or procedures and directly communicating with the patient by discussing pertinent items from her history and physical exam as well as detailing my assessment and plan as noted above so that she has an informed understanding.  All of her questions were answered.  Finis Bud, M.D. 03/30/2020 12:01 PM

## 2020-03-30 NOTE — Progress Notes (Signed)
   Covid-19 Vaccination Clinic  Name:  Martha Freeman    MRN: [id number] DOB: 04/02/1976  03/30/2020  [name] was observed post Covid-19 immunization for 15 minutes without incident. She was provided with Vaccine Information Sheet and instruction to access the V-Safe system.   [name] was instructed to call 911 with any severe reactions post vaccine: Marland Kitchen Difficulty breathing  . Swelling of face and throat  . A fast heartbeat  . A bad rash all over body  . Dizziness and weakness   Immunizations Administered    Name Date Dose VIS Date Route   Pfizer COVID-19 Vaccine 03/30/2020 11:22 AM 0.3 mL 01/11/2019 Intramuscular   Manufacturer: [place]   Lot: [id number]   [address] [place]: [id number]

## 2020-03-31 ENCOUNTER — Ambulatory Visit: Payer: Medicaid Other

## 2020-04-19 ENCOUNTER — Ambulatory Visit (INDEPENDENT_AMBULATORY_CARE_PROVIDER_SITE_OTHER): Payer: Self-pay | Admitting: Obstetrics and Gynecology

## 2020-04-19 ENCOUNTER — Other Ambulatory Visit: Payer: Self-pay

## 2020-04-19 ENCOUNTER — Encounter: Payer: Self-pay | Admitting: Obstetrics and Gynecology

## 2020-04-19 VITALS — BP 119/78 | HR 77 | Ht 67.0 in | Wt 202.0 lb

## 2020-04-19 DIAGNOSIS — N83299 Other ovarian cyst, unspecified side: Secondary | ICD-10-CM

## 2020-04-19 DIAGNOSIS — Z01818 Encounter for other preprocedural examination: Secondary | ICD-10-CM

## 2020-04-19 NOTE — H&P (View-Only) (Signed)
PRE-OPERATIVE HISTORY AND PHYSICAL EXAM  PCP:  [facility] [place] Subjective:   HPI:  Martha Freeman is a 44 y.o. [id number].  [name] was 04/08/2020.  She presents today for a pre-op discussion and PE.  She has the following symptoms: Complex ovarian cyst.  Review of Systems:   Constitutional: Denied constitutional symptoms, night sweats, recent illness, fatigue, fever, insomnia and weight loss.  Eyes: Denied eye symptoms, eye pain, photophobia, vision change and visual disturbance.  Ears/Nose/Throat/Neck: Denied ear, nose, throat or neck symptoms, hearing loss, nasal discharge, sinus congestion and sore throat.  Cardiovascular: Denied cardiovascular symptoms, arrhythmia, chest pain/pressure, edema, exercise intolerance, orthopnea and palpitations.  Respiratory: Denied pulmonary symptoms, asthma, pleuritic pain, productive sputum, cough, dyspnea and wheezing.  Gastrointestinal: Denied, gastro-esophageal reflux, melena, nausea and vomiting.  Genitourinary: Denied genitourinary symptoms including symptomatic vaginal discharge, pelvic relaxation issues, and urinary complaints.  Musculoskeletal: Denied musculoskeletal symptoms, stiffness, swelling, muscle weakness and myalgia.  Dermatologic: Denied dermatology symptoms, rash and scar.  Neurologic: Denied neurology symptoms, dizziness, headache, neck pain and syncope.  Psychiatric: Denied psychiatric symptoms, anxiety and depression.  Endocrine: Denied endocrine symptoms including hot flashes and night sweats.   OB History  Gravida Para Term Preterm AB Living  3 2 2   1 2   SAB TAB Ectopic Multiple Live Births  1       2    # Outcome Date GA Lbr Len/2nd Weight Sex Delivery Anes PTL Lv  3 Term 04/19/04   8 lb (3.629 kg) F CS-LTranv  N LIV  2 SAB 2004          1 Term 11/16/01   5 lb 15 oz (2.693 kg) M CS-LTranv EPI N LIV    Past Medical History:  Diagnosis Date  . Anxiety   . Depression    . Fatigue   . Heavy periods   . Hemorrhoids   . IBS (irritable bowel syndrome)   . Kidney stones   . Ovarian cyst    both sides    Past Surgical History:  Procedure Laterality Date  . RHINOPLASTY    . TONSILLECTOMY    . TUBAL LIGATION        SOCIAL HISTORY: Social History   Tobacco Use  Smoking Status Current Some Day Smoker  . Packs/day: 0.25  . Years: 10.00  . Pack years: 2.50  . Types: Cigarettes  Smokeless Tobacco Never Used   Social History   Substance and Sexual Activity  Alcohol Use Yes  . Alcohol/week: 0.0 standard drinks   Comment: occasional    Social History   Substance and Sexual Activity  Drug Use No    Family History  Problem Relation Age of Onset  . Cancer Mother        ENDOMETRIAL CANCER  . Cancer Paternal Aunt        BREAST  . Heart disease Maternal Grandmother   . Breast cancer Neg Hx   . Ovarian cancer Neg Hx     ALLERGIES:  Aspirin  MEDS:   Current Outpatient Medications on File Prior to Visit  Medication Sig Dispense Refill  . APPLE CIDER VINEGAR PO Take 2 tablets by mouth in the morning and at bedtime.    . cyanocobalamin (,VITAMIN B-12,) 1000 MCG/ML injection 1ML q monthly 1 mL 6  . hydrocortisone (ANUSOL-HC) 2.5 % rectal cream PLACE 1 APPLICATION RECTALLY 2 (TWO) TIMES DAILY. (Patient not taking: Reported on 04/19/2020) 28.35 g  1  . hydrocortisone-pramoxine (PROCTOFOAM-HC) rectal foam Place 1 applicator rectally 3 (three) times daily. (Patient not taking: Reported on 04/19/2020) 10 g 0  . Lactobacillus (AZO COMPLETE FEMININE BALANCE) CAPS Take 1 capsule by mouth daily as needed.    . lactulose (CHRONULAC) 10 GM/15ML solution Take 30 mLs (20 g total) by mouth daily as needed for mild constipation. (Patient not taking: Reported on 04/19/2020) 120 mL 0  . ondansetron (ZOFRAN ODT) 4 MG disintegrating tablet Take 1 tablet (4 mg total) by mouth every 8 (eight) hours as needed for nausea or vomiting. (Patient not taking: Reported on  04/19/2020) 20 tablet 0  . oxyCODONE-acetaminophen (PERCOCET) 5-325 MG tablet Take 1 tablet by mouth every 8 (eight) hours as needed. (Patient not taking: Reported on 03/30/2020) 20 tablet 0  . oxyCODONE-acetaminophen (PERCOCET/ROXICET) 5-325 MG tablet Take 1 tablet by mouth every 4 (four) hours as needed for severe pain. (Patient not taking: Reported on 03/30/2020) 15 tablet 0  . tamsulosin (FLOMAX) 0.4 MG CAPS capsule Take 1 capsule (0.4 mg total) by mouth daily after breakfast. (Patient not taking: Reported on 04/19/2020) 30 capsule 0   No current facility-administered medications on file prior to visit.    No orders of the defined types were placed in this encounter.    Physical examination BP 119/78   Pulse 77   Ht 5\' 7"  (1.702 m)   Wt 202 lb (91.6 kg)   LMP 04/08/2020   BMI 31.64 kg/m   General NAD, Conversant  HEENT Atraumatic; Op clear with mmm.  Normo-cephalic. Pupils reactive. Anicteric sclerae  Thyroid/Neck Smooth without nodularity or enlargement. Normal ROM.  Neck Supple.  Skin No rashes, lesions or ulceration. Normal palpated skin turgor. No nodularity.  Breasts: No masses or discharge.  Symmetric.  No axillary adenopathy.  Lungs: Clear to auscultation.No rales or wheezes. Normal Respiratory effort, no retractions.  Heart: NSR.  No murmurs or rubs appreciated. No periferal edema  Abdomen: Soft.  Non-tender.  No masses.  No HSM. No hernia  Extremities: Moves all appropriately.  Normal ROM for age. No lymphadenopathy.  Neuro: Oriented to PPT.  Normal mood. Normal affect.     Pelvic:   Vulva: Normal appearance.  No lesions.  Vagina: No lesions or abnormalities noted.  Support: Normal pelvic support.  Urethra No masses tenderness or scarring.  Meatus Normal size without lesions or prolapse.  Cervix: Normal ectropion.  No lesions.  Anus: Normal exam.  No lesions.  Perineum: Normal exam.  No lesions.        Bimanual   Uterus: Normal size.  Non-tender.  Mobile.  AV.    Adnexae: No masses.  Non-tender to palpation.  Cul-de-sac: Negative for abnormality.   Assessment:   [id number] Patient Active Problem List   Diagnosis Date Noted  . Ovarian cyst 03/07/2020    1. Preop examination   2. Complex ovarian cyst    Previous consult with GYN oncology labels this a low risk for cancer ovarian cyst and believes appropriate management includes LAVH BSO. (Dr. Fransisca Connors)   Plan:   Orders: No orders of the defined types were placed in this encounter.    1.  LAVH BSO

## 2020-04-19 NOTE — Progress Notes (Signed)
PRE-OPERATIVE HISTORY AND PHYSICAL EXAM  PCP:  [facility] [place] Subjective:   HPI:  Martha Freeman is a 44 y.o. [id number].  [name] was 04/08/2020.  She presents today for a pre-op discussion and PE.  She has the following symptoms: Complex ovarian cyst.  Review of Systems:   Constitutional: Denied constitutional symptoms, night sweats, recent illness, fatigue, fever, insomnia and weight loss.  Eyes: Denied eye symptoms, eye pain, photophobia, vision change and visual disturbance.  Ears/Nose/Throat/Neck: Denied ear, nose, throat or neck symptoms, hearing loss, nasal discharge, sinus congestion and sore throat.  Cardiovascular: Denied cardiovascular symptoms, arrhythmia, chest pain/pressure, edema, exercise intolerance, orthopnea and palpitations.  Respiratory: Denied pulmonary symptoms, asthma, pleuritic pain, productive sputum, cough, dyspnea and wheezing.  Gastrointestinal: Denied, gastro-esophageal reflux, melena, nausea and vomiting.  Genitourinary: Denied genitourinary symptoms including symptomatic vaginal discharge, pelvic relaxation issues, and urinary complaints.  Musculoskeletal: Denied musculoskeletal symptoms, stiffness, swelling, muscle weakness and myalgia.  Dermatologic: Denied dermatology symptoms, rash and scar.  Neurologic: Denied neurology symptoms, dizziness, headache, neck pain and syncope.  Psychiatric: Denied psychiatric symptoms, anxiety and depression.  Endocrine: Denied endocrine symptoms including hot flashes and night sweats.   OB History  Gravida Para Term Preterm AB Living  3 2 2   1 2   SAB TAB Ectopic Multiple Live Births  1       2    # Outcome Date GA Lbr Len/2nd Weight Sex Delivery Anes PTL Lv  3 Term 04/19/04   8 lb (3.629 kg) F CS-LTranv  N LIV  2 SAB 2004          1 Term 11/16/01   5 lb 15 oz (2.693 kg) M CS-LTranv EPI N LIV    Past Medical History:  Diagnosis Date  . Anxiety   . Depression    . Fatigue   . Heavy periods   . Hemorrhoids   . IBS (irritable bowel syndrome)   . Kidney stones   . Ovarian cyst    both sides    Past Surgical History:  Procedure Laterality Date  . RHINOPLASTY    . TONSILLECTOMY    . TUBAL LIGATION        SOCIAL HISTORY: Social History   Tobacco Use  Smoking Status Current Some Day Smoker  . Packs/day: 0.25  . Years: 10.00  . Pack years: 2.50  . Types: Cigarettes  Smokeless Tobacco Never Used   Social History   Substance and Sexual Activity  Alcohol Use Yes  . Alcohol/week: 0.0 standard drinks   Comment: occasional    Social History   Substance and Sexual Activity  Drug Use No    Family History  Problem Relation Age of Onset  . Cancer Mother        ENDOMETRIAL CANCER  . Cancer Paternal Aunt        BREAST  . Heart disease Maternal Grandmother   . Breast cancer Neg Hx   . Ovarian cancer Neg Hx     ALLERGIES:  Aspirin  MEDS:   Current Outpatient Medications on File Prior to Visit  Medication Sig Dispense Refill  . APPLE CIDER VINEGAR PO Take 2 tablets by mouth in the morning and at bedtime.    . cyanocobalamin (,VITAMIN B-12,) 1000 MCG/ML injection 1ML q monthly 1 mL 6  . hydrocortisone (ANUSOL-HC) 2.5 % rectal cream PLACE 1 APPLICATION RECTALLY 2 (TWO) TIMES DAILY. (Patient not taking: Reported on 04/19/2020) 28.35 g  1  . hydrocortisone-pramoxine (PROCTOFOAM-HC) rectal foam Place 1 applicator rectally 3 (three) times daily. (Patient not taking: Reported on 04/19/2020) 10 g 0  . Lactobacillus (AZO COMPLETE FEMININE BALANCE) CAPS Take 1 capsule by mouth daily as needed.    . lactulose (CHRONULAC) 10 GM/15ML solution Take 30 mLs (20 g total) by mouth daily as needed for mild constipation. (Patient not taking: Reported on 04/19/2020) 120 mL 0  . ondansetron (ZOFRAN ODT) 4 MG disintegrating tablet Take 1 tablet (4 mg total) by mouth every 8 (eight) hours as needed for nausea or vomiting. (Patient not taking: Reported on  04/19/2020) 20 tablet 0  . oxyCODONE-acetaminophen (PERCOCET) 5-325 MG tablet Take 1 tablet by mouth every 8 (eight) hours as needed. (Patient not taking: Reported on 03/30/2020) 20 tablet 0  . oxyCODONE-acetaminophen (PERCOCET/ROXICET) 5-325 MG tablet Take 1 tablet by mouth every 4 (four) hours as needed for severe pain. (Patient not taking: Reported on 03/30/2020) 15 tablet 0  . tamsulosin (FLOMAX) 0.4 MG CAPS capsule Take 1 capsule (0.4 mg total) by mouth daily after breakfast. (Patient not taking: Reported on 04/19/2020) 30 capsule 0   No current facility-administered medications on file prior to visit.    No orders of the defined types were placed in this encounter.    Physical examination BP 119/78   Pulse 77   Ht 5\' 7"  (1.702 m)   Wt 202 lb (91.6 kg)   LMP 04/08/2020   BMI 31.64 kg/m   General NAD, Conversant  HEENT Atraumatic; Op clear with mmm.  Normo-cephalic. Pupils reactive. Anicteric sclerae  Thyroid/Neck Smooth without nodularity or enlargement. Normal ROM.  Neck Supple.  Skin No rashes, lesions or ulceration. Normal palpated skin turgor. No nodularity.  Breasts: No masses or discharge.  Symmetric.  No axillary adenopathy.  Lungs: Clear to auscultation.No rales or wheezes. Normal Respiratory effort, no retractions.  Heart: NSR.  No murmurs or rubs appreciated. No periferal edema  Abdomen: Soft.  Non-tender.  No masses.  No HSM. No hernia  Extremities: Moves all appropriately.  Normal ROM for age. No lymphadenopathy.  Neuro: Oriented to PPT.  Normal mood. Normal affect.     Pelvic:   Vulva: Normal appearance.  No lesions.  Vagina: No lesions or abnormalities noted.  Support: Normal pelvic support.  Urethra No masses tenderness or scarring.  Meatus Normal size without lesions or prolapse.  Cervix: Normal ectropion.  No lesions.  Anus: Normal exam.  No lesions.  Perineum: Normal exam.  No lesions.        Bimanual   Uterus: Normal size.  Non-tender.  Mobile.  AV.    Adnexae: No masses.  Non-tender to palpation.  Cul-de-sac: Negative for abnormality.   Assessment:   [id number] Patient Active Problem List   Diagnosis Date Noted  . Ovarian cyst 03/07/2020    1. Preop examination   2. Complex ovarian cyst    Previous consult with GYN oncology labels this a low risk for cancer ovarian cyst and believes appropriate management includes LAVH BSO. (Dr. Fransisca Connors)   Plan:   Orders: No orders of the defined types were placed in this encounter.    1.  LAVH BSO  Pre-op discussions regarding Risks and Benefits of her scheduled surgery.  LAVH The procedure of Laparoscopic Assisted Vaginal Hysterectomy was described to the patient in detail.  We reviewed the rationale for Hysterectomy and the patient was again informed of other nonsurgical management possibilities for her condition.  She has considered these other options,  and desires a Hysterectomy.  We have reviewed the fact that Hysterectomy is permanent and that following the procedure she will not be able to become pregnant or bear children.  We have discussed the following risk factors specifically and the patient has also been informed that additional complications not mentioned may develop:  Damage to bowel, bladder, ureters or to other internal organs, bleeding, infection and the risk from anesthesia.  We have discussed the procedure itself in detail and she has an informed understanding of this surgery.  We have also discussed the recovery period in which physical and sexual activity will be restricted for a varying degree of time, often 3 - 6 weeks. The Laparoscopic Portion of Hysterectomy has also been reviewed with the patient.  She understands how the laparoscope facilitates the procedure.  We have discussed the abdominal incisions and punctures that will be used.  We have also reviewed the increased Operating Room time often accompanying LAVH.  The slightly increased risk of complications secondary to  abdominal punctures, and use of laparoscopic instrumentation has also been discussed in detail.I have answered all of her questions and I believe the patient has an informed understanding of the procedure of Laparoscopic Assisted Vaginal Hysterectomy.  Oophorectomy The option of Oophorectomy has been discussed with the patient.  Detailed risk/benefits have been reviewed.  The risks discussed include, but are not limited to, hemorrhage, infection, damage to ureter or other internal organ, and Ovarian Remnant Syndrome.  The benefits include a significant decrease in the risk of Ovarian Cancer and in benign Ovarian disease.  The risk of Ovarian CA has been estimated at 1 in 75.  This is a relatively small risk.  However, should Ovarian CA develop, it is often found late in the course of the disease.  We have also discussed the role of inheritance in the development of Ovarian disease.  Some women, who have close relatives with Ovarian CA, have a higher than 1 in 70 risk of Ovarian CA.  The benefits of Estrogen replacement therapy following Oophorectomy has been stressed.  If she is premenopausal, we have discussed the fact that this procedure will make her permanently sterile and that premature menopause will result if no ERT is begun.  I have answered all of her questions, and I believe that she has an adequate and informed understanding of the risks and benefits of Oophorectomy. Patient states that she would like both ovaries removed at the time of surgery.  I spent 34 minutes involved in the care of this patient preparing to see the patient by obtaining and reviewing her medical history (including labs, imaging tests and prior procedures), documenting clinical information in the electronic health record (EHR), counseling and coordinating care plans, writing and sending prescriptions, ordering tests or procedures and directly communicating with the patient by discussing pertinent items from her history and  physical exam as well as detailing my assessment and plan as noted above so that she has an informed understanding.  All of her questions were answered.  Finis Bud, M.D. 04/19/2020 12:27 PM

## 2020-04-19 NOTE — H&P (Signed)
PRE-OPERATIVE HISTORY AND PHYSICAL EXAM  PCP:  [facility] [place] Subjective:   HPI:  Martha Freeman is a 44 y.o. [id number].  [name] was 04/08/2020.  She presents today for a pre-op discussion and PE.  She has the following symptoms: Complex ovarian cyst.  Review of Systems:   Constitutional: Denied constitutional symptoms, night sweats, recent illness, fatigue, fever, insomnia and weight loss.  Eyes: Denied eye symptoms, eye pain, photophobia, vision change and visual disturbance.  Ears/Nose/Throat/Neck: Denied ear, nose, throat or neck symptoms, hearing loss, nasal discharge, sinus congestion and sore throat.  Cardiovascular: Denied cardiovascular symptoms, arrhythmia, chest pain/pressure, edema, exercise intolerance, orthopnea and palpitations.  Respiratory: Denied pulmonary symptoms, asthma, pleuritic pain, productive sputum, cough, dyspnea and wheezing.  Gastrointestinal: Denied, gastro-esophageal reflux, melena, nausea and vomiting.  Genitourinary: Denied genitourinary symptoms including symptomatic vaginal discharge, pelvic relaxation issues, and urinary complaints.  Musculoskeletal: Denied musculoskeletal symptoms, stiffness, swelling, muscle weakness and myalgia.  Dermatologic: Denied dermatology symptoms, rash and scar.  Neurologic: Denied neurology symptoms, dizziness, headache, neck pain and syncope.  Psychiatric: Denied psychiatric symptoms, anxiety and depression.  Endocrine: Denied endocrine symptoms including hot flashes and night sweats.   OB History  Gravida Para Term Preterm AB Living  3 2 2   1 2   SAB TAB Ectopic Multiple Live Births  1       2    # Outcome Date GA Lbr Len/2nd Weight Sex Delivery Anes PTL Lv  3 Term 04/19/04   8 lb (3.629 kg) F CS-LTranv  N LIV  2 SAB 2004          1 Term 11/16/01   5 lb 15 oz (2.693 kg) M CS-LTranv EPI N LIV    Past Medical History:  Diagnosis Date  . Anxiety   . Depression    . Fatigue   . Heavy periods   . Hemorrhoids   . IBS (irritable bowel syndrome)   . Kidney stones   . Ovarian cyst    both sides    Past Surgical History:  Procedure Laterality Date  . RHINOPLASTY    . TONSILLECTOMY    . TUBAL LIGATION        SOCIAL HISTORY: Social History   Tobacco Use  Smoking Status Current Some Day Smoker  . Packs/day: 0.25  . Years: 10.00  . Pack years: 2.50  . Types: Cigarettes  Smokeless Tobacco Never Used   Social History   Substance and Sexual Activity  Alcohol Use Yes  . Alcohol/week: 0.0 standard drinks   Comment: occasional    Social History   Substance and Sexual Activity  Drug Use No    Family History  Problem Relation Age of Onset  . Cancer Mother        ENDOMETRIAL CANCER  . Cancer Paternal Aunt        BREAST  . Heart disease Maternal Grandmother   . Breast cancer Neg Hx   . Ovarian cancer Neg Hx     ALLERGIES:  Aspirin  MEDS:   Current Outpatient Medications on File Prior to Visit  Medication Sig Dispense Refill  . APPLE CIDER VINEGAR PO Take 2 tablets by mouth in the morning and at bedtime.    . cyanocobalamin (,VITAMIN B-12,) 1000 MCG/ML injection 1ML q monthly 1 mL 6  . hydrocortisone (ANUSOL-HC) 2.5 % rectal cream PLACE 1 APPLICATION RECTALLY 2 (TWO) TIMES DAILY. (Patient not taking: Reported on 04/19/2020) 28.35 g  1  . hydrocortisone-pramoxine (PROCTOFOAM-HC) rectal foam Place 1 applicator rectally 3 (three) times daily. (Patient not taking: Reported on 04/19/2020) 10 g 0  . Lactobacillus (AZO COMPLETE FEMININE BALANCE) CAPS Take 1 capsule by mouth daily as needed.    . lactulose (CHRONULAC) 10 GM/15ML solution Take 30 mLs (20 g total) by mouth daily as needed for mild constipation. (Patient not taking: Reported on 04/19/2020) 120 mL 0  . ondansetron (ZOFRAN ODT) 4 MG disintegrating tablet Take 1 tablet (4 mg total) by mouth every 8 (eight) hours as needed for nausea or vomiting. (Patient not taking: Reported on  04/19/2020) 20 tablet 0  . oxyCODONE-acetaminophen (PERCOCET) 5-325 MG tablet Take 1 tablet by mouth every 8 (eight) hours as needed. (Patient not taking: Reported on 03/30/2020) 20 tablet 0  . oxyCODONE-acetaminophen (PERCOCET/ROXICET) 5-325 MG tablet Take 1 tablet by mouth every 4 (four) hours as needed for severe pain. (Patient not taking: Reported on 03/30/2020) 15 tablet 0  . tamsulosin (FLOMAX) 0.4 MG CAPS capsule Take 1 capsule (0.4 mg total) by mouth daily after breakfast. (Patient not taking: Reported on 04/19/2020) 30 capsule 0   No current facility-administered medications on file prior to visit.    No orders of the defined types were placed in this encounter.    Physical examination BP 119/78   Pulse 77   Ht 5\' 7"  (1.702 m)   Wt 202 lb (91.6 kg)   LMP 04/08/2020   BMI 31.64 kg/m   General NAD, Conversant  HEENT Atraumatic; Op clear with mmm.  Normo-cephalic. Pupils reactive. Anicteric sclerae  Thyroid/Neck Smooth without nodularity or enlargement. Normal ROM.  Neck Supple.  Skin No rashes, lesions or ulceration. Normal palpated skin turgor. No nodularity.  Breasts: No masses or discharge.  Symmetric.  No axillary adenopathy.  Lungs: Clear to auscultation.No rales or wheezes. Normal Respiratory effort, no retractions.  Heart: NSR.  No murmurs or rubs appreciated. No periferal edema  Abdomen: Soft.  Non-tender.  No masses.  No HSM. No hernia  Extremities: Moves all appropriately.  Normal ROM for age. No lymphadenopathy.  Neuro: Oriented to PPT.  Normal mood. Normal affect.     Pelvic:   Vulva: Normal appearance.  No lesions.  Vagina: No lesions or abnormalities noted.  Support: Normal pelvic support.  Urethra No masses tenderness or scarring.  Meatus Normal size without lesions or prolapse.  Cervix: Normal ectropion.  No lesions.  Anus: Normal exam.  No lesions.  Perineum: Normal exam.  No lesions.        Bimanual   Uterus: Normal size.  Non-tender.  Mobile.  AV.    Adnexae: No masses.  Non-tender to palpation.  Cul-de-sac: Negative for abnormality.   Assessment:   [id number] Patient Active Problem List   Diagnosis Date Noted  . Ovarian cyst 03/07/2020    1. Preop examination   2. Complex ovarian cyst    Previous consult with GYN oncology labels this a low risk for cancer ovarian cyst and believes appropriate management includes LAVH BSO. (Dr. Fransisca Connors)   Plan:   Orders: No orders of the defined types were placed in this encounter.    1.  LAVH BSO

## 2020-04-24 ENCOUNTER — Ambulatory Visit: Payer: Medicaid Other | Attending: Internal Medicine

## 2020-04-24 DIAGNOSIS — Z23 Encounter for immunization: Secondary | ICD-10-CM

## 2020-04-24 NOTE — Progress Notes (Signed)
   Covid-19 Vaccination Clinic  Name:  Martha Freeman    MRN: [id number] DOB: 13-Aug-1976  04/24/2020  [name] was observed post Covid-19 immunization for 15 minutes without incident. She was provided with Vaccine Information Sheet and instruction to access the V-Safe system.   [name] was instructed to call 911 with any severe reactions post vaccine: Marland Kitchen Difficulty breathing  . Swelling of face and throat  . A fast heartbeat  . A bad rash all over body  . Dizziness and weakness   Immunizations Administered    Name Date Dose VIS Date [place] COVID-19 Vaccine 04/24/2020 11:50 AM 0.3 mL 01/11/2019 Intramuscular   Manufacturer: [place]   Lot: [id number]   [place]: [id number]

## 2020-04-30 ENCOUNTER — Telehealth: Payer: Self-pay | Admitting: Obstetrics and Gynecology

## 2020-04-30 NOTE — Telephone Encounter (Signed)
Patient is scheduled to have surgrey the 21st, she was supposed to start on some antibiotics Thursday before her surgery but the pharmacy doesn't have anything called in for her. Could you please advise?

## 2020-05-03 ENCOUNTER — Other Ambulatory Visit: Payer: Self-pay

## 2020-05-03 ENCOUNTER — Encounter
Admission: RE | Admit: 2020-05-03 | Discharge: 2020-05-03 | Disposition: A | Discharge status: Home / Self Care | Payer: Medicaid Other | Source: Ambulatory Visit | Attending: Obstetrics and Gynecology | Admitting: Obstetrics and Gynecology

## 2020-05-03 DIAGNOSIS — Z01818 Encounter for other preprocedural examination: Secondary | ICD-10-CM | POA: Insufficient documentation

## 2020-05-03 HISTORY — DX: Personal history of urinary calculi: Z87.442

## 2020-05-03 HISTORY — DX: Family history of other specified conditions: Z84.89

## 2020-05-03 NOTE — Patient Instructions (Signed)
Your procedure is scheduled on: Mon 6/21 Report to Day Surgery. To find out your arrival time please call [phone] between 1PM - 3PM on. Frid. 6/18  Remember: Instructions that are not followed completely may result in serious medical risk,  up to and including death, or upon the discretion of your surgeon and anesthesiologist your  surgery may need to be rescheduled.     _X__ 1. Do not eat food after midnight the night before your procedure.                 No gum chewing or hard candies. You may drink clear liquids up to 2 hours                 before you are scheduled to arrive for your surgery- DO not drink clear                 liquids within 2 hours of the start of your surgery.                 Clear Liquids include:  water, apple juice without pulp, clear Gatorade, G2 or                  Gatorade Zero (avoid Red/Purple/Blue), Black Coffee or Tea (Do not add                 anything to coffee or tea). ___x__2.   Complete the carbohydrate drink provided to you, 2 hours before arrival.  __X__2.  On the morning of surgery brush your teeth with toothpaste and water, you                may rinse your mouth with mouthwash if you wish.  Do not swallow any toothpaste of mouthwash.     _X__ 3.  No Alcohol for 24 hours before or after surgery.   _X__ 4.  Do Not Smoke or use e-cigarettes For 24 Hours Prior to Your Surgery.                 Do not use any chewable tobacco products for at least 6 hours prior to                 Surgery.  _X__  5.  Do not use any recreational drugs (marijuana, cocaine, heroin, ecstacy, MDMA or other)                For at least one week prior to your surgery.  Combination of these drugs with anesthesia                May have life threatening results.  ____  6.  Bring all medications with you on the day of surgery if instructed.   __x__  7.  Notify your doctor if there is any change in your medical condition      (cold, fever,  infections).     Do not wear jewelry, make-up, hairpins, clips or nail polish. Do not wear lotions, powders, or perfumes. You may wear deodorant. Do not shave 48 hours prior to surgery. Do not bring valuables to the hospital.    [facility] is not responsible for any belongings or valuables.  Contacts, dentures or bridgework may not be worn into surgery. Leave your suitcase in the car. After surgery it may be brought to your room. For patients admitted to the hospital, discharge time is determined by your treatment team.   Patients discharged the  day of surgery will not be allowed to drive home.   Make arrangements for someone to be with you for the first 24 hours of your Same Day Discharge.    Please read over the following fact sheets that you were given:      _x___ Take these medicines the morning of surgery with A SIP OF WATER:    1. none  2.   3.   4.  5.  6.  ____ Fleet Enema (as directed)   __x__ Use CHG Soap (or wipes) as directed  ____ Use Benzoyl Peroxide Gel as instructed  ____ Use inhalers on the day of surgery  ____ Stop metformin 2 days prior to surgery    ____ Take 1/2 of usual insulin dose the night before surgery. No insulin the morning          of surgery.   ____ Stop Coumadin/Plavix/aspirin on   __x__ Stop Anti-inflammatories ibuprofen or aleve.  May continue tylenol   __x__ Stop supplements until after surgery.  melatonin 5 MG TABS, BLACK ELDERBERRY PO  ____ Bring C-Pap to the hospital.

## 2020-05-04 ENCOUNTER — Telehealth: Payer: Self-pay | Admitting: Obstetrics and Gynecology

## 2020-05-04 ENCOUNTER — Other Ambulatory Visit
Admission: RE | Admit: 2020-05-04 | Discharge: 2020-05-04 | Disposition: A | Discharge status: Home / Self Care | Payer: Medicaid Other | Source: Ambulatory Visit | Attending: Obstetrics and Gynecology | Admitting: Obstetrics and Gynecology

## 2020-05-04 DIAGNOSIS — Z01812 Encounter for preprocedural laboratory examination: Secondary | ICD-10-CM | POA: Insufficient documentation

## 2020-05-04 DIAGNOSIS — Z20822 Contact with and (suspected) exposure to covid-19: Secondary | ICD-10-CM | POA: Insufficient documentation

## 2020-05-04 LAB — CBC
HCT: 36.8 % (ref 36.0–46.0)
Hemoglobin: 12.1 g/dL (ref 12.0–15.0)
MCH: 26.9 pg (ref 26.0–34.0)
MCHC: 32.9 g/dL (ref 30.0–36.0)
MCV: 82 fL (ref 80.0–100.0)
Platelets: 376 10*3/uL (ref 150–400)
RBC: 4.49 MIL/uL (ref 3.87–5.11)
RDW: 14.6 % (ref 11.5–15.5)
WBC: 8.2 10*3/uL (ref 4.0–10.5)
nRBC: 0 % (ref 0.0–0.2)

## 2020-05-04 LAB — TYPE AND SCREEN
ABO/RH(D): A NEG
Antibody Screen: NEGATIVE

## 2020-05-04 LAB — SARS CORONAVIRUS 2 (TAT 6-24 HRS): SARS Coronavirus 2: NEGATIVE

## 2020-05-04 MED ORDER — METRONIDAZOLE 500 MG PO TABS
500.0000 mg | ORAL_TABLET | Freq: Two times a day (BID) | ORAL | 0 refills | Status: DC
Start: 2020-05-04 — End: 2020-05-08

## 2020-05-04 NOTE — Telephone Encounter (Signed)
I have sent prescription to the pharmacy and notified patient.

## 2020-05-04 NOTE — Addendum Note (Signed)
Addended by: Durwin Glaze on: 05/04/2020 10:19 AM   Modules accepted: Orders

## 2020-05-04 NOTE — Telephone Encounter (Signed)
Patient does not have a yeast infection now. She will discuss with Dr. Amalia Hailey at the hospital. She said that she gets them after taking antibiotics.

## 2020-05-04 NOTE — Telephone Encounter (Signed)
This patient called in x2 because she is scheduled to have surgery within the next few days and she was supposed to start a dose of antibiotics prior to the surgery however they have not been sent into the pharmacy. Could you please advise?

## 2020-05-04 NOTE — Telephone Encounter (Signed)
Patient also would like to know if we could call her in something for yeast infections as antibiotics tend to have that affect on her.

## 2020-05-04 NOTE — Telephone Encounter (Signed)
Pt is aware that her medication was sent in to her pharmacy today. Pt stated that she picked it up already.

## 2020-05-07 ENCOUNTER — Observation Stay
Admission: AD | Admit: 2020-05-07 | Discharge: 2020-05-08 | Disposition: A | Discharge status: Home / Self Care | Payer: Self-pay | Attending: Obstetrics and Gynecology | Admitting: Obstetrics and Gynecology

## 2020-05-07 ENCOUNTER — Other Ambulatory Visit: Payer: Self-pay

## 2020-05-07 ENCOUNTER — Encounter: Payer: Self-pay | Admitting: Obstetrics and Gynecology

## 2020-05-07 ENCOUNTER — Ambulatory Visit: Payer: Self-pay | Admitting: Anesthesiology

## 2020-05-07 ENCOUNTER — Encounter
Admission: AD | Disposition: A | Discharge status: Home / Self Care | Payer: Self-pay | Source: Home / Self Care | Attending: Obstetrics and Gynecology

## 2020-05-07 DIAGNOSIS — N840 Polyp of corpus uteri: Secondary | ICD-10-CM | POA: Insufficient documentation

## 2020-05-07 DIAGNOSIS — Z79899 Other long term (current) drug therapy: Secondary | ICD-10-CM | POA: Insufficient documentation

## 2020-05-07 DIAGNOSIS — D270 Benign neoplasm of right ovary: Principal | ICD-10-CM | POA: Insufficient documentation

## 2020-05-07 DIAGNOSIS — N83299 Other ovarian cyst, unspecified side: Secondary | ICD-10-CM

## 2020-05-07 DIAGNOSIS — D261 Other benign neoplasm of corpus uteri: Secondary | ICD-10-CM | POA: Insufficient documentation

## 2020-05-07 DIAGNOSIS — Z8049 Family history of malignant neoplasm of other genital organs: Secondary | ICD-10-CM | POA: Insufficient documentation

## 2020-05-07 HISTORY — PX: LAPAROSCOPIC VAGINAL HYSTERECTOMY WITH SALPINGO OOPHORECTOMY: SHX6681

## 2020-05-07 LAB — ABO/RH: ABO/RH(D): A NEG

## 2020-05-07 LAB — POCT PREGNANCY, URINE: Preg Test, Ur: NEGATIVE

## 2020-05-07 SURGERY — HYSTERECTOMY, VAGINAL, LAPAROSCOPY-ASSISTED, WITH SALPINGO-OOPHORECTOMY
Anesthesia: General | Laterality: Bilateral

## 2020-05-07 MED ORDER — KETOROLAC TROMETHAMINE 30 MG/ML IJ SOLN
INTRAMUSCULAR | Status: AC
  Administered 2020-05-07: 30 mg via INTRAVENOUS
  Filled 2020-05-07: qty 1

## 2020-05-07 MED ORDER — FENTANYL CITRATE (PF) 100 MCG/2ML IJ SOLN
INTRAMUSCULAR | Status: AC
  Administered 2020-05-07: 50 ug via INTRAVENOUS
  Filled 2020-05-07: qty 2

## 2020-05-07 MED ORDER — MIDAZOLAM HCL 2 MG/2ML IJ SOLN
INTRAMUSCULAR | Status: AC
  Filled 2020-05-07: qty 2

## 2020-05-07 MED ORDER — LACTATED RINGERS IV SOLN: INTRAVENOUS | Status: DC

## 2020-05-07 MED ORDER — ONDANSETRON HCL 4 MG/2ML IJ SOLN
4.0000 mg | Freq: Four times a day (QID) | INTRAMUSCULAR | Status: DC | PRN
  Administered 2020-05-07: 4 mg via INTRAVENOUS
  Filled 2020-05-07: qty 2

## 2020-05-07 MED ORDER — SIMETHICONE 80 MG PO CHEW
80.0000 mg | CHEWABLE_TABLET | Freq: Four times a day (QID) | ORAL | Status: DC | PRN
  Administered 2020-05-07 – 2020-05-08 (×2): 80 mg via ORAL
  Filled 2020-05-07 (×2): qty 1

## 2020-05-07 MED ORDER — DEXAMETHASONE SODIUM PHOSPHATE 10 MG/ML IJ SOLN
INTRAMUSCULAR | Status: AC
  Filled 2020-05-07: qty 1

## 2020-05-07 MED ORDER — ORAL CARE MOUTH RINSE: 15.0000 mL | Freq: Once | OROMUCOSAL | Status: AC

## 2020-05-07 MED ORDER — PROPOFOL 10 MG/ML IV BOLUS
INTRAVENOUS | Status: DC | PRN
  Administered 2020-05-07: 170 mg via INTRAVENOUS

## 2020-05-07 MED ORDER — SODIUM CHLORIDE (PF) 0.9 % IJ SOLN
INTRAMUSCULAR | Status: AC
  Filled 2020-05-07: qty 50

## 2020-05-07 MED ORDER — VASOPRESSIN 20 UNIT/ML IV SOLN
INTRAVENOUS | Status: DC | PRN
  Administered 2020-05-07: 6 mL via INTRAMUSCULAR

## 2020-05-07 MED ORDER — LIDOCAINE HCL (CARDIAC) PF 100 MG/5ML IV SOSY
PREFILLED_SYRINGE | INTRAVENOUS | Status: DC | PRN
  Administered 2020-05-07: 100 mg via INTRAVENOUS

## 2020-05-07 MED ORDER — PROMETHAZINE HCL 25 MG/ML IJ SOLN
INTRAMUSCULAR | Status: AC
  Administered 2020-05-07: 12.5 mg via INTRAVENOUS
  Filled 2020-05-07: qty 1

## 2020-05-07 MED ORDER — CHLORHEXIDINE GLUCONATE 0.12 % MT SOLN
OROMUCOSAL | Status: AC
  Administered 2020-05-07: 15 mL via OROMUCOSAL
  Filled 2020-05-07: qty 15

## 2020-05-07 MED ORDER — CEFAZOLIN SODIUM-DEXTROSE 2-4 GM/100ML-% IV SOLN
2.0000 g | INTRAVENOUS | Status: AC
  Administered 2020-05-07: 2 g via INTRAVENOUS

## 2020-05-07 MED ORDER — DEXAMETHASONE SODIUM PHOSPHATE 10 MG/ML IJ SOLN
INTRAMUSCULAR | Status: DC | PRN
  Administered 2020-05-07: 10 mg via INTRAVENOUS

## 2020-05-07 MED ORDER — OXYCODONE HCL 5 MG PO TABS: 5.0000 mg | ORAL_TABLET | Freq: Once | ORAL | Status: DC | PRN

## 2020-05-07 MED ORDER — ACETAMINOPHEN 10 MG/ML IV SOLN
INTRAVENOUS | Status: DC | PRN
  Administered 2020-05-07: 1000 mg via INTRAVENOUS

## 2020-05-07 MED ORDER — CHLORHEXIDINE GLUCONATE 0.12 % MT SOLN: 15.0000 mL | Freq: Once | OROMUCOSAL | Status: AC

## 2020-05-07 MED ORDER — BUPIVACAINE HCL (PF) 0.5 % IJ SOLN
INTRAMUSCULAR | Status: AC
  Filled 2020-05-07: qty 30

## 2020-05-07 MED ORDER — FENTANYL CITRATE (PF) 100 MCG/2ML IJ SOLN
INTRAMUSCULAR | Status: AC
  Filled 2020-05-07: qty 2

## 2020-05-07 MED ORDER — HYDROMORPHONE HCL 1 MG/ML IJ SOLN
0.2500 mg | INTRAMUSCULAR | Status: DC | PRN
  Administered 2020-05-07: 0.5 mg via INTRAVENOUS

## 2020-05-07 MED ORDER — IBUPROFEN 800 MG PO TABS: 800.0000 mg | ORAL_TABLET | Freq: Three times a day (TID) | ORAL | Status: DC

## 2020-05-07 MED ORDER — SUGAMMADEX SODIUM 200 MG/2ML IV SOLN
INTRAVENOUS | Status: DC | PRN
  Administered 2020-05-07: 200 mg via INTRAVENOUS

## 2020-05-07 MED ORDER — FAMOTIDINE 20 MG PO TABS: 20.0000 mg | ORAL_TABLET | Freq: Once | ORAL | Status: AC

## 2020-05-07 MED ORDER — ONDANSETRON HCL 4 MG/2ML IJ SOLN
INTRAMUSCULAR | Status: AC
  Filled 2020-05-07: qty 2

## 2020-05-07 MED ORDER — SODIUM CHLORIDE FLUSH 0.9 % IV SOLN
INTRAVENOUS | Status: AC
  Filled 2020-05-07: qty 10

## 2020-05-07 MED ORDER — ESTRADIOL 1 MG PO TABS
1.0000 mg | ORAL_TABLET | Freq: Every day | ORAL | Status: DC
  Administered 2020-05-08: 1 mg via ORAL
  Filled 2020-05-07: qty 1

## 2020-05-07 MED ORDER — HYDROMORPHONE HCL 1 MG/ML IJ SOLN
INTRAMUSCULAR | Status: AC
  Administered 2020-05-07: 0.5 mg via INTRAVENOUS
  Filled 2020-05-07: qty 1

## 2020-05-07 MED ORDER — FENTANYL CITRATE (PF) 100 MCG/2ML IJ SOLN
25.0000 ug | INTRAMUSCULAR | Status: DC | PRN
  Administered 2020-05-07: 50 ug via INTRAVENOUS

## 2020-05-07 MED ORDER — OXYCODONE-ACETAMINOPHEN 5-325 MG PO TABS
1.0000 | ORAL_TABLET | ORAL | Status: DC | PRN
  Administered 2020-05-07 (×2): 2 via ORAL
  Administered 2020-05-07 – 2020-05-08 (×2): 1 via ORAL
  Filled 2020-05-07: qty 1
  Filled 2020-05-07: qty 2
  Filled 2020-05-07: qty 1
  Filled 2020-05-07: qty 2

## 2020-05-07 MED ORDER — DEXTROSE IN LACTATED RINGERS 5 % IV SOLN: INTRAVENOUS | Status: DC

## 2020-05-07 MED ORDER — CEFAZOLIN SODIUM-DEXTROSE 2-4 GM/100ML-% IV SOLN
INTRAVENOUS | Status: AC
  Filled 2020-05-07: qty 100

## 2020-05-07 MED ORDER — MIDAZOLAM HCL 2 MG/2ML IJ SOLN
INTRAMUSCULAR | Status: DC | PRN
  Administered 2020-05-07: 2 mg via INTRAVENOUS

## 2020-05-07 MED ORDER — FENTANYL CITRATE (PF) 100 MCG/2ML IJ SOLN
INTRAMUSCULAR | Status: DC | PRN
  Administered 2020-05-07 (×2): 50 ug via INTRAVENOUS

## 2020-05-07 MED ORDER — ACETAMINOPHEN 10 MG/ML IV SOLN
INTRAVENOUS | Status: AC
  Filled 2020-05-07: qty 100

## 2020-05-07 MED ORDER — ROCURONIUM BROMIDE 100 MG/10ML IV SOLN
INTRAVENOUS | Status: DC | PRN
  Administered 2020-05-07: 20 mg via INTRAVENOUS
  Administered 2020-05-07: 50 mg via INTRAVENOUS

## 2020-05-07 MED ORDER — PROPOFOL 10 MG/ML IV BOLUS
INTRAVENOUS | Status: AC
  Filled 2020-05-07: qty 20

## 2020-05-07 MED ORDER — IBUPROFEN 800 MG PO TABS
800.0000 mg | ORAL_TABLET | Freq: Three times a day (TID) | ORAL | Status: DC
  Administered 2020-05-07 – 2020-05-08 (×2): 800 mg via ORAL
  Filled 2020-05-07 (×2): qty 1

## 2020-05-07 MED ORDER — ONDANSETRON HCL 4 MG/2ML IJ SOLN
INTRAMUSCULAR | Status: DC | PRN
  Administered 2020-05-07: 4 mg via INTRAVENOUS

## 2020-05-07 MED ORDER — EPHEDRINE SULFATE 50 MG/ML IJ SOLN
INTRAMUSCULAR | Status: DC | PRN
  Administered 2020-05-07: 10 mg via INTRAVENOUS

## 2020-05-07 MED ORDER — ROCURONIUM BROMIDE 10 MG/ML (PF) SYRINGE
PREFILLED_SYRINGE | INTRAVENOUS | Status: AC
  Filled 2020-05-07: qty 10

## 2020-05-07 MED ORDER — MEPERIDINE HCL 50 MG/ML IJ SOLN: 6.2500 mg | INTRAMUSCULAR | Status: DC | PRN

## 2020-05-07 MED ORDER — KETOROLAC TROMETHAMINE 30 MG/ML IJ SOLN: 30.0000 mg | Freq: Once | INTRAMUSCULAR | Status: AC

## 2020-05-07 MED ORDER — PROMETHAZINE HCL 25 MG/ML IJ SOLN: 6.2500 mg | INTRAMUSCULAR | Status: DC | PRN

## 2020-05-07 MED ORDER — VASOPRESSIN 20 UNIT/ML IV SOLN
INTRAVENOUS | Status: AC
  Filled 2020-05-07: qty 1

## 2020-05-07 MED ORDER — OXYCODONE HCL 5 MG/5ML PO SOLN: 5.0000 mg | Freq: Once | ORAL | Status: DC | PRN

## 2020-05-07 MED ORDER — FAMOTIDINE 20 MG PO TABS
ORAL_TABLET | ORAL | Status: AC
  Administered 2020-05-07: 20 mg via ORAL
  Filled 2020-05-07: qty 1

## 2020-05-07 MED ORDER — KETOROLAC TROMETHAMINE 30 MG/ML IJ SOLN
30.0000 mg | Freq: Four times a day (QID) | INTRAMUSCULAR | Status: DC | PRN
  Administered 2020-05-07: 30 mg via INTRAVENOUS
  Filled 2020-05-07: qty 1

## 2020-05-07 SURGICAL SUPPLY — 42 items
ADHESIVE MASTISOL STRL (MISCELLANEOUS) ×3 IMPLANT
BAG URINE DRAIN 2000ML AR STRL (UROLOGICAL SUPPLIES) ×3 IMPLANT
BLADE SURG SZ11 CARB STEEL (BLADE) ×3 IMPLANT
CATH FOLEY 2WAY  5CC 16FR (CATHETERS) ×3
CATH URTH 16FR FL 2W BLN LF (CATHETERS) ×1 IMPLANT
CHLORAPREP W/TINT 26 (MISCELLANEOUS) ×3 IMPLANT
CLOSURE WOUND 1/2 X4 (GAUZE/BANDAGES/DRESSINGS) ×1
COVER WAND RF STERILE (DRAPES) IMPLANT
DEFOGGER SCOPE WARMER CLEARIFY (MISCELLANEOUS) ×3 IMPLANT
ELECT REM PT RETURN 9FT ADLT (ELECTROSURGICAL) ×3
ELECTRODE REM PT RTRN 9FT ADLT (ELECTROSURGICAL) ×1 IMPLANT
GLOVE BIO SURGEON STRL SZ8 (GLOVE) ×6 IMPLANT
GLOVE BIOGEL PI ORTHO PRO 7.5 (GLOVE) ×2
GLOVE PI ORTHO PRO STRL 7.5 (GLOVE) ×1 IMPLANT
GOWN STRL REUS W/ TWL LRG LVL3 (GOWN DISPOSABLE) ×3 IMPLANT
GOWN STRL REUS W/ TWL XL LVL3 (GOWN DISPOSABLE) ×3 IMPLANT
GOWN STRL REUS W/TWL LRG LVL3 (GOWN DISPOSABLE) ×9
GOWN STRL REUS W/TWL XL LVL3 (GOWN DISPOSABLE) ×9
HANDLE YANKAUER SUCT BULB TIP (MISCELLANEOUS) ×3 IMPLANT
IRRIGATION STRYKERFLOW (MISCELLANEOUS) ×1 IMPLANT
IRRIGATOR STRYKERFLOW (MISCELLANEOUS) ×3
IV LACTATED RINGERS 1000ML (IV SOLUTION) IMPLANT
KIT PINK PAD W/HEAD ARE REST (MISCELLANEOUS) ×3
KIT PINK PAD W/HEAD ARM REST (MISCELLANEOUS) ×1 IMPLANT
KIT TURNOVER CYSTO (KITS) ×3 IMPLANT
LIGASURE LAP MARYLAND 5MM 37CM (ELECTROSURGICAL) ×3 IMPLANT
PACK BASIN MINOR (MISCELLANEOUS) ×3 IMPLANT
PACK GYN LAPAROSCOPIC (MISCELLANEOUS) ×3 IMPLANT
PAD OB MATERNITY 4.3X12.25 (PERSONAL CARE ITEMS) ×3 IMPLANT
PAD PREP 24X41 OB/GYN DISP (PERSONAL CARE ITEMS) ×3 IMPLANT
SLEEVE ENDOPATH XCEL 5M (ENDOMECHANICALS) ×6 IMPLANT
SOLUTION ELECTROLUBE (MISCELLANEOUS) ×3 IMPLANT
SPONGE LAP 18X18 RF (DISPOSABLE) ×3 IMPLANT
STRIP CLOSURE SKIN 1/2X4 (GAUZE/BANDAGES/DRESSINGS) ×2 IMPLANT
SUT VIC AB 0 CT1 27 (SUTURE) ×6
SUT VIC AB 0 CT1 27XCR 8 STRN (SUTURE) ×2 IMPLANT
SUT VIC AB 0 CT1 36 (SUTURE) ×6 IMPLANT
SUT VIC AB 4-0 FS2 27 (SUTURE) ×3 IMPLANT
SYR 10ML LL (SYRINGE) ×3 IMPLANT
TAPE TRANSPORE STRL 2 31045 (GAUZE/BANDAGES/DRESSINGS) ×3 IMPLANT
TROCAR XCEL NON-BLD 5MMX100MML (ENDOMECHANICALS) ×3 IMPLANT
TUBING EVAC SMOKE HEATED PNEUM (TUBING) ×3 IMPLANT

## 2020-05-07 NOTE — Anesthesia Postprocedure Evaluation (Signed)
Anesthesia Post Note  Patient: Martha Freeman  Procedure(s) Performed: LAPAROSCOPIC ASSISTED VAGINAL HYSTERECTOMY WITH SALPINGO OOPHORECTOMY (Bilateral )  Patient location during evaluation: PACU Anesthesia Type: General Level of consciousness: awake and alert and oriented Pain management: pain level controlled Vital Signs Assessment: post-procedure vital signs reviewed and stable Respiratory status: spontaneous breathing, nonlabored ventilation and respiratory function stable Cardiovascular status: blood pressure returned to baseline and stable Postop Assessment: no signs of nausea or vomiting Anesthetic complications: no   No complications documented.   Last Vitals:  Vitals:   05/07/20 1317 05/07/20 1335  BP: 130/77 132/74  Pulse: 61   Resp: (!) 0 18  Temp: (!) 36.3 C 36.8 C  SpO2: 100% 99%    Last Pain:  Vitals:   05/07/20 1335  TempSrc: Oral  PainSc:                  [name]

## 2020-05-07 NOTE — Anesthesia Procedure Notes (Signed)
Procedure Name: Intubation Date/Time: 05/07/2020 9:29 AM Performed by: Aline Brochure, CRNA Pre-anesthesia Checklist: Patient identified, Patient being monitored, Timeout performed, Emergency Drugs available and Suction available Patient Re-evaluated:Patient Re-evaluated prior to induction Oxygen Delivery Method: Circle system utilized Preoxygenation: Pre-oxygenation with 100% oxygen Induction Type: IV induction Ventilation: Mask ventilation without difficulty Laryngoscope Size: 3 and McGraph Grade View: Grade I Tube type: Oral Tube size: 7.0 mm Number of attempts: 1 Airway Equipment and Method: Stylet Placement Confirmation: ETT inserted through vocal cords under direct vision,  positive ETCO2 and breath sounds checked- equal and bilateral Secured at: 21 cm Tube secured with: Tape Dental Injury: Teeth and Oropharynx as per pre-operative assessment

## 2020-05-07 NOTE — Anesthesia Preprocedure Evaluation (Signed)
Anesthesia Evaluation  Patient identified by MRN, date of birth, ID band Patient awake    Reviewed: Allergy & Precautions, NPO status , Patient's Chart, lab work & pertinent test results  History of Anesthesia Complications Negative for: history of anesthetic complications (mother had seizure after getting morphine)  Airway Mallampati: III  TM Distance: >3 FB Neck ROM: Full    Dental  (+) Missing, Poor Dentition   Pulmonary neg sleep apnea, neg COPD, Current Smoker (vapes) and Patient abstained from smoking., former smoker,    breath sounds clear to auscultation- rhonchi (-) wheezing      Cardiovascular Exercise Tolerance: Good (-) hypertension(-) CAD, (-) Past MI, (-) Cardiac Stents and (-) CABG  Rhythm:Regular Rate:Normal - Systolic murmurs and - Diastolic murmurs    Neuro/Psych neg Seizures PSYCHIATRIC DISORDERS Anxiety Depression negative neurological ROS     GI/Hepatic negative GI ROS, Neg liver ROS,   Endo/Other  negative endocrine ROSneg diabetes  Renal/GU negative Renal ROS     Musculoskeletal negative musculoskeletal ROS (+)   Abdominal (+) + obese,   Peds  Hematology negative hematology ROS (+)   Anesthesia Other Findings Past Medical History: No date: Anxiety No date: Depression     Comment:  post partum No date: Family history of adverse reaction to anesthesia     Comment:  seizure with morphine No date: Fatigue No date: Heavy periods No date: Hemorrhoids No date: History of kidney stones No date: IBS (irritable bowel syndrome) No date: Ovarian cyst     Comment:  both sides   Reproductive/Obstetrics                             Anesthesia Physical Anesthesia Plan  ASA: II  Anesthesia Plan: General   Post-op Pain Management:    Induction: Intravenous  PONV Risk Score and Plan: 2 and Ondansetron, Dexamethasone and Midazolam  Airway Management Planned: Oral  ETT  Additional Equipment:   Intra-op Plan:   Post-operative Plan: Extubation in OR  Informed Consent: I have reviewed the patients History and Physical, chart, labs and discussed the procedure including the risks, benefits and alternatives for the proposed anesthesia with the patient or authorized representative who has indicated his/her understanding and acceptance.     Dental advisory given  Plan Discussed with: CRNA and Anesthesiologist  Anesthesia Plan Comments:         Anesthesia Quick Evaluation

## 2020-05-07 NOTE — Interval H&P Note (Signed)
History and Physical Interval Note:  05/07/2020 9:21 AM  Wylene Men Martha Freeman  has presented today for surgery, with the diagnosis of Complex ovrian cyst.  The various methods of treatment have been discussed with the patient and family. After consideration of risks, benefits and other options for treatment, the patient has consented to  Procedure(s): LAPAROSCOPIC ASSISTED VAGINAL HYSTERECTOMY WITH SALPINGO OOPHORECTOMY (Bilateral) as a surgical intervention.  The patient's history has been reviewed, patient examined, no change in status, stable for surgery.  I have reviewed the patient's chart and labs.  Questions were answered to the patient's satisfaction.     Jeannie Fend

## 2020-05-07 NOTE — Transfer of Care (Signed)
Immediate Anesthesia Transfer of Care Note  Patient: Martha Freeman  Procedure(s) Performed: LAPAROSCOPIC ASSISTED VAGINAL HYSTERECTOMY WITH SALPINGO OOPHORECTOMY (Bilateral )  Patient Location: PACU  Anesthesia Type:General  Level of Consciousness: drowsy and patient cooperative  Airway & Oxygen Therapy: Patient Spontanous Breathing and Patient connected to face mask oxygen  Post-op Assessment: Report given to RN and Post -op Vital signs reviewed and stable  Post vital signs: Reviewed and stable  Last Vitals:  Vitals Value Taken Time  BP 115/103 05/07/20 1206  Temp    Pulse 97 05/07/20 1207  Resp 19 05/07/20 1207  SpO2 100 % 05/07/20 1207  Vitals shown include unvalidated device data.  Last Pain:  Vitals:   05/07/20 0830  TempSrc: Temporal  PainSc: 0-No pain         Complications: No complications documented.

## 2020-05-07 NOTE — Op Note (Signed)
OPERATIVE NOTE 05/07/2020 12:14 PM  PRE-OPERATIVE DIAGNOSIS:  1) Complex ovrian cyst  POST-OPERATIVE DIAGNOSIS:  1) complex ovarian cyst (right)  OPERATION: Procedure(s) (LRB): LAPAROSCOPIC ASSISTED VAGINAL HYSTERECTOMY WITH SALPINGO OOPHORECTOMY (Bilateral)     SURGEON(S): Surgeon(s) and Role:    Harlin Heys, MD - Primary    * Rubie Maid, MD - Assisting No other capable assistant available for this surgery which requires an experienced, high level assistant.   ANESTHESIA: General  ESTIMATED BLOOD LOSS: 118mL  OPERATIVE FINDINGS: Large complex right ovarian cyst  SPECIMEN:  ID Type Source Tests Collected by Time Destination  1 : Left ovary, uterus and cervix Tissue PATH Other SURGICAL PATHOLOGY Harlin Heys, MD 05/07/2020 1119   2 : Right ovary Tissue PATH Other SURGICAL PATHOLOGY Harlin Heys, MD 05/07/2020 1123   A : Fluid from right ovary Body Fluid PATH Cytology Misc. fluid CYTOLOGY - NON PAP Harlin Heys, MD 12/11/5807 9833     COMPLICATIONS: None  DRAINS: Foley to gravity  DISPOSITION: Stable to recovery room  DESCRIPTION OF PROCEDURE:      The patient was prepped and draped in the dorsolithotomy position and placed under general anesthesia. The bladder was emptied. The cervix was grasped with a multi-toothed tenaculum and a uterine manipulator was placed within the cervical os respecting the position and curvature of the uterus. After changing gloves we proceeded abdominally. A small infraumbilical incision was made and a 5 mm trocar port was placed within the abdominopelvic cavity. The opening pressure was less than 7 mmHg.  Approximately 3 and 1/2 L of carbon dioxide gas was instilled within the abdominal pelvic cavity. The laparoscope was placed and the pelvis and abdomen were carefully inspected. In the usual manner, under direct visualization right and left lower quadrant ports of 5 mm size were placed. Both ureters were identified  in the pelvis prior to dissection or clamping and cutting of pedicles. The infundibulopelvic ligaments were carefully identified. The ureters were again identified out of the operative field. The ligaments were triply coagulated and divided. Hemostasis of the pedicles was noted.  The round ligaments were coagulated and divided and a bladder flap was created. The upper aspect of the broad ligament was clamped coagulated and divided. The uterine arteries were skeletonized, triply coagulated and divided. Careful inspection of all pedicles and the remainder of the pelvis was performed. Hemostasis was noted. The lower quadrant ports were removed, hemostasis of the port sites was noted, and the incisions were closed in subcuticular manner. The laparoscope and trocar sleeve were removed from the infraumbilical incision, hemostasis was noted, and the incision was closed in a subcuticular manner. A long-acting anesthetic was employed in the skin incisions. We then proceeded vaginally. A weighted speculum was placed posteriorly. A multi-toothed tenaculum was used to grasp the cervix and the cervix was injected in a circumferential manner with a dilute Pitressin solution. An incision was made around the cervix and the vaginal mucosa was dissected off of the cervix. The posterior cul-de-sac was identified and entered and the weighted speculum was placed within this. The anterior cul-de-sac was identified and entered and a retractor was placed and used to retract the bladder anteriorly keeping it out of the operative field. The uterosacral ligaments were clamped divided and suture ligated. The cardinal ligaments were clamped divided and suture ligated. The small remaining pedicle was clamped divided and suture ligated bilaterally allowing delivery of the specimen with the exception of the right  ovary.  It was found to be too large to extract vaginally intact.  A large syringe with a spinal needle was placed within the  ovary and greater than 60 cc of fluid was removed.  The suction was then placed over this small hole and the remainder of the ovarian fluid was sucked out allowing delivery of the right ovary.  The fluid was sent separately for evaluation.  This procedure was performed in a way that prevented intraperitoneal fluid contamination from the ovary. Angle sutures were placed in the usual manner. A culdoplasty was performed. The peritoneum was identified anteriorly and then incorporating the left upper pedicle left lower pedicle right lower pedicle right upper pedicle and anterior peritoneum a pursestring suture was placed exteriorizing all pedicles. Hemostasis of all pedicles was noted at this time. The vaginal mucosa was then closed with a running suture of Vicryl. The patient went to recovery room in stable condition. Clear urine was noted in the Foley at the conclusion of the procedure.  Finis Bud, M.D. 05/07/2020 12:14 PM

## 2020-05-08 ENCOUNTER — Encounter: Payer: Self-pay | Admitting: Obstetrics and Gynecology

## 2020-05-08 MED ORDER — ESTRADIOL 1 MG PO TABS: 1.0000 mg | ORAL_TABLET | Freq: Every day | ORAL | 0 refills | Status: DC

## 2020-05-08 NOTE — Progress Notes (Signed)
Discharge instructions complete and prescriptions sent to the pharmacy by the provider. Patient verbalizes understanding of teaching. Patient discharged home via wheelchair at 1320.

## 2020-05-08 NOTE — Discharge Summary (Signed)
Discharge Summary  Admit date: 05/07/2020  Discharge Date and Time:05/08/2020  9:27 AM  Discharge to:  Home  Admission Diagnosis: Present on Admission: . Complex ovarian cyst                     Discharge  Diagnoses: Active Problems:   Complex ovarian cyst   OR Procedures:   Procedure(s): LAPAROSCOPIC ASSISTED VAGINAL HYSTERECTOMY WITH SALPINGO OOPHORECTOMY Date -------------------                              Discharge Day Progress Note:   Subjective:   The patient does not have complaints.  She is ambulating well. She is taking PO well. Her pain is well controlled with her current medications. She is urinating without difficulty and is passing flatus.   Objective:  BP (!) 103/50 (BP Location: Right Arm)   Pulse 90   Temp 98.8 F (37.1 C) (Oral)   Resp 18   Ht 5\' 7"  (1.702 m)   Wt 91.6 kg   LMP 05/02/2020   SpO2 99%   BMI 31.63 kg/m     Abdomen:                         clean, dry, no drainage    Assessment:   Doing well.  Normal progress as expected.     Plan:        Discharge home.                       Medications as directed.  Hospital Course:  No notes on file   Condition at Discharge:  good Discharge Medications:  Allergies as of 05/08/2020      Reactions   Aspirin Other (See Comments)   "I don't know what kind of reaction I had. It was when I was little."      Medication List    STOP taking these medications   acetaminophen 500 MG tablet Commonly known as: TYLENOL   metroNIDAZOLE 500 MG tablet Commonly known as: Flagyl   oxyCODONE-acetaminophen 5-325 MG tablet Commonly known as: PERCOCET/ROXICET     TAKE these medications   APPLE CIDER VINEGAR PO Take 2 tablets by mouth 3 (three) times daily after meals. GUMMIES   BLACK ELDERBERRY PO Take 50 mg by mouth at bedtime.   cyanocobalamin 1000 MCG/ML injection Commonly known as: (VITAMIN B-12) 1ML q monthly What changed:   how much to take  how to take this  when to take  this  additional instructions   estradiol 1 MG tablet Commonly known as: ESTRACE Take 1 tablet (1 mg total) by mouth daily.   Estroven Menopause & Weight 40-56-300 MG Caps Generic drug: Black Cohosh-SoyIsoflav-C Quad Take 1 tablet by mouth at bedtime.   hydrocortisone 2.5 % rectal cream Commonly known as: ANUSOL-HC PLACE 1 APPLICATION RECTALLY 2 (TWO) TIMES DAILY. What changed: See the new instructions.   hydrocortisone-pramoxine rectal foam Commonly known as: PROCTOFOAM-HC Place 1 applicator rectally 3 (three) times daily.   lactulose 10 GM/15ML solution Commonly known as: CHRONULAC Take 30 mLs (20 g total) by mouth daily as needed for mild constipation.   melatonin 5 MG Tabs Take 5 mg by mouth at bedtime.   PROBIOTIC PO Take 2 capsules by mouth every evening. WITH [facility]            Discharge Care Instructions  (  From admission, onward)         Start     Ordered   05/08/20 0000  No dressing needed       Comments: Keep wound area clean and dry as directed   05/08/20 0859           Follow Up:    Follow-up Information    Harlin Heys, MD Follow up in 1 week(s).   Specialties: Obstetrics and Gynecology, Radiology Contact information: [address] [address] [phone]               Finis Bud, M.D. 05/08/2020 9:27 AM

## 2020-05-09 ENCOUNTER — Other Ambulatory Visit: Payer: Medicaid Other

## 2020-05-09 LAB — SURGICAL PATHOLOGY

## 2020-05-09 LAB — CYTOLOGY - NON PAP

## 2020-05-16 ENCOUNTER — Encounter: Payer: Self-pay | Admitting: Obstetrics and Gynecology

## 2020-05-16 ENCOUNTER — Other Ambulatory Visit: Payer: Self-pay

## 2020-05-16 ENCOUNTER — Ambulatory Visit (INDEPENDENT_AMBULATORY_CARE_PROVIDER_SITE_OTHER): Payer: Medicaid Other | Admitting: Obstetrics and Gynecology

## 2020-05-16 VITALS — BP 102/73 | HR 112 | Ht 67.0 in | Wt 195.4 lb

## 2020-05-16 DIAGNOSIS — Z9889 Other specified postprocedural states: Secondary | ICD-10-CM

## 2020-05-16 NOTE — Progress Notes (Addendum)
HPI:      [name] is a 44 y.o. [id number] who LMP was [name] was 05/02/2020.  Subjective:   She presents today 1 week postop.  She is doing well.  She has no complaints.  She did have occasional night sweats but she is taking her estrogen and says they are not bad. She had some concern regarding return to vaginal function and feeling and was not happy regarding the bloating immediately postop. She is voiding eating and having normal bowel movements.    Hx: The following portions of the patient's history were reviewed and updated as appropriate:             She  has a past medical history of Anxiety, Depression, Family history of adverse reaction to anesthesia, Fatigue, Heavy periods, Hemorrhoids, History of kidney stones, IBS (irritable bowel syndrome), and Ovarian cyst. She does not have any pertinent problems on file. She  has a past surgical history that includes Rhinoplasty; Tubal ligation; Tonsillectomy; and Laparoscopic vaginal hysterectomy with salpingo oophorectomy (Bilateral, 05/07/2020). Her family history includes Cancer in her mother and paternal aunt; Heart disease in her maternal grandmother. She  reports that she quit smoking about 2 months ago. Her smoking use included cigarettes. She has a 2.50 pack-year smoking history. She has never used smokeless tobacco. She reports current alcohol use. She reports that she does not use drugs. She has a current medication list which includes the following prescription(s): apple cider vinegar, black elderberry, cyanocobalamin, estradiol, estroven menopause & weight, hydrocortisone, hydrocortisone-pramoxine, lactulose, melatonin, and probiotic product. She is allergic to aspirin.       Review of Systems:  Review of Systems  Constitutional: Denied constitutional symptoms, night sweats, recent illness, fatigue, fever, insomnia and weight loss.  Eyes: Denied eye symptoms, eye pain, photophobia, vision change and  visual disturbance.  Ears/Nose/Throat/Neck: Denied ear, nose, throat or neck symptoms, hearing loss, nasal discharge, sinus congestion and sore throat.  Cardiovascular: Denied cardiovascular symptoms, arrhythmia, chest pain/pressure, edema, exercise intolerance, orthopnea and palpitations.  Respiratory: Denied pulmonary symptoms, asthma, pleuritic pain, productive sputum, cough, dyspnea and wheezing.  Gastrointestinal: Denied, gastro-esophageal reflux, melena, nausea and vomiting.  Genitourinary: Denied genitourinary symptoms including symptomatic vaginal discharge, pelvic relaxation issues, and urinary complaints.  Musculoskeletal: Denied musculoskeletal symptoms, stiffness, swelling, muscle weakness and myalgia.  Dermatologic: Denied dermatology symptoms, rash and scar.  Neurologic: Denied neurology symptoms, dizziness, headache, neck pain and syncope.  Psychiatric: Denied psychiatric symptoms, anxiety and depression.  Endocrine: Denied endocrine symptoms including hot flashes and night sweats.   Meds:   Current Outpatient Medications on File Prior to Visit  Medication Sig Dispense Refill  . APPLE CIDER VINEGAR PO Take 2 tablets by mouth 3 (three) times daily after meals. GUMMIES    . BLACK ELDERBERRY PO Take 50 mg by mouth at bedtime.    . cyanocobalamin (,VITAMIN B-12,) 1000 MCG/ML injection 1ML q monthly (Patient taking differently: Inject 1,000 mcg into the muscle every 30 (thirty) days. ) 1 mL 6  . estradiol (ESTRACE) 1 MG tablet Take 1 tablet (1 mg total) by mouth daily. 60 tablet 0  . ESTROVEN MENOPAUSE & WEIGHT 40-56-300 MG CAPS Take 1 tablet by mouth at bedtime.    . hydrocortisone (ANUSOL-HC) 2.5 % rectal cream PLACE 1 APPLICATION RECTALLY 2 (TWO) TIMES DAILY. (Patient taking differently: Place 1 application rectally 2 (two) times daily as needed for hemorrhoids or anal itching. ) 28.35 g 1  . hydrocortisone-pramoxine (PROCTOFOAM-HC) rectal foam Place  1 applicator rectally 3 (three)  times daily. 10 g 0  . lactulose (CHRONULAC) 10 GM/15ML solution Take 30 mLs (20 g total) by mouth daily as needed for mild constipation. 120 mL 0  . melatonin 5 MG TABS Take 5 mg by mouth at bedtime.    . Probiotic Product (PROBIOTIC PO) Take 2 capsules by mouth every evening. WITH [facility]     No current facility-administered medications on file prior to visit.    Objective:     Vitals:   05/16/20 1436  BP: 102/73  Pulse: (!) 112               Abdomen: Soft.  Non-tender.  No masses.  No HSM.  Incision/s: Intact.  Healing well.  No erythema.  No drainage.      Assessment:    [id number] Patient Active Problem List   Diagnosis Date Noted  . 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 Ovarian cyst 03/07/2020     1. Post-operative state     Patient doing very well postop.  We have discussed her concerns with bloating and return to vaginal function.  I have reassured her that she will return to normal function and the bloating will resolve.   Plan:            1.  Postop incision care as directed.  No heavy lifting.  2.  Continue estrogen-consider dose increase at 5-week checkup if patient continues to experience night sweats or hot flashes.  3.  Adenomyosis and mucinous cystadenoma diagnosis reviewed.  Not cancer!  Stressed. Orders No orders of the defined types were placed in this encounter.   No orders of the defined types were placed in this encounter.     F/U  Return in about 5 weeks (around 06/20/2020) for As Scheduled Post-op.  Finis Bud, M.D. 05/16/2020 3:11 PM

## 2020-06-12 ENCOUNTER — Telehealth: Payer: Self-pay | Admitting: Obstetrics and Gynecology

## 2020-06-12 NOTE — Telephone Encounter (Signed)
Please advise 

## 2020-06-12 NOTE — Telephone Encounter (Signed)
Pt called in and stated that she is having some discharge and odor, its light brown , the pt said its not blood. The pt says that she has had it for a couple of week, and she knows she will be seeing the doctor 8/4. The pt was wanting to know if she can have some antibiotics sent to [facility] in [place] on [address]. Please advise

## 2020-06-14 MED ORDER — METRONIDAZOLE 500 MG PO TABS
500.0000 mg | ORAL_TABLET | Freq: Two times a day (BID) | ORAL | 0 refills | Status: DC
Start: 2020-06-14 — End: 2020-06-27

## 2020-06-14 NOTE — Telephone Encounter (Signed)
LM for patient that prescription has been sent to the pharmacy.

## 2020-06-14 NOTE — Addendum Note (Signed)
Addended by: Durwin Glaze on: 06/14/2020 10:57 AM   Modules accepted: Orders

## 2020-06-20 ENCOUNTER — Encounter: Payer: Medicaid Other | Admitting: Obstetrics and Gynecology

## 2020-06-27 ENCOUNTER — Encounter: Payer: Self-pay | Admitting: Obstetrics and Gynecology

## 2020-06-27 ENCOUNTER — Ambulatory Visit (INDEPENDENT_AMBULATORY_CARE_PROVIDER_SITE_OTHER): Payer: Medicaid Other | Admitting: Obstetrics and Gynecology

## 2020-06-27 VITALS — BP 118/80 | HR 91 | Ht 67.0 in | Wt 199.6 lb

## 2020-06-27 DIAGNOSIS — Z9889 Other specified postprocedural states: Secondary | ICD-10-CM

## 2020-06-27 NOTE — Progress Notes (Signed)
HPI:      Martha Freeman is a 44 y.o. [id number] who LMP was [name] was 05/02/2020.  Subjective:   She presents today approximately 5 weeks from LAVH BSO.  She reports that she is doing very well.  She reports the bloating has resolved.  She is having normal regular bowel movements and voiding without difficulty. (She reports these as if both are improvements for her.)  She has returned to work and is doing well. She is taking her estrogen as directed but complains of occasional hot flashes and more commonly night sweats.  [place] sneaker)    Hx: The following portions of the patient's history were reviewed and updated as appropriate:             She  has a past medical history of Anxiety, Depression, Family history of adverse reaction to anesthesia, Fatigue, Heavy periods, Hemorrhoids, History of kidney stones, IBS (irritable bowel syndrome), and Ovarian cyst. She does not have any pertinent problems on file. She  has a past surgical history that includes Rhinoplasty; Tubal ligation; Tonsillectomy; and Laparoscopic vaginal hysterectomy with salpingo oophorectomy (Bilateral, 05/07/2020). Her family history includes Cancer in her mother and paternal aunt; Heart disease in her maternal grandmother. She  reports that she quit smoking about 3 months ago. Her smoking use included cigarettes. She has a 2.50 pack-year smoking history. She has never used smokeless tobacco. She reports current alcohol use. She reports that she does not use drugs. She has a current medication list which includes the following prescription(s): apple cider vinegar, black elderberry, cyanocobalamin, estradiol, estroven menopause & weight, hydrocortisone, hydrocortisone-pramoxine, lactulose, melatonin, and probiotic product. She is allergic to aspirin.       Review of Systems:  Review of Systems  Constitutional: Denied constitutional symptoms, night sweats, recent illness, fatigue, fever, insomnia  and weight loss.  Eyes: Denied eye symptoms, eye pain, photophobia, vision change and visual disturbance.  Ears/Nose/Throat/Neck: Denied ear, nose, throat or neck symptoms, hearing loss, nasal discharge, sinus congestion and sore throat.  Cardiovascular: Denied cardiovascular symptoms, arrhythmia, chest pain/pressure, edema, exercise intolerance, orthopnea and palpitations.  Respiratory: Denied pulmonary symptoms, asthma, pleuritic pain, productive sputum, cough, dyspnea and wheezing.  Gastrointestinal: Denied, gastro-esophageal reflux, melena, nausea and vomiting.  Genitourinary: Denied genitourinary symptoms including symptomatic vaginal discharge, pelvic relaxation issues, and urinary complaints.  Musculoskeletal: Denied musculoskeletal symptoms, stiffness, swelling, muscle weakness and myalgia.  Dermatologic: Denied dermatology symptoms, rash and scar.  Neurologic: Denied neurology symptoms, dizziness, headache, neck pain and syncope.  Psychiatric: Denied psychiatric symptoms, anxiety and depression.  Endocrine: Denied endocrine symptoms including hot flashes and night sweats.   Meds:   Current Outpatient Medications on File Prior to Visit  Medication Sig Dispense Refill  . APPLE CIDER VINEGAR PO Take 2 tablets by mouth 3 (three) times daily after meals. GUMMIES    . BLACK ELDERBERRY PO Take 50 mg by mouth at bedtime.    . cyanocobalamin (,VITAMIN B-12,) 1000 MCG/ML injection 1ML q monthly (Patient taking differently: Inject 1,000 mcg into the muscle every 30 (thirty) days. ) 1 mL 6  . estradiol (ESTRACE) 1 MG tablet Take 1 tablet (1 mg total) by mouth daily. 60 tablet 0  . ESTROVEN MENOPAUSE & WEIGHT 40-56-300 MG CAPS Take 1 tablet by mouth at bedtime.    . hydrocortisone (ANUSOL-HC) 2.5 % rectal cream PLACE 1 APPLICATION RECTALLY 2 (TWO) TIMES DAILY. (Patient taking differently: Place 1 application rectally 2 (two) times daily as needed for hemorrhoids  or anal itching. ) 28.35 g 1  .  hydrocortisone-pramoxine (PROCTOFOAM-HC) rectal foam Place 1 applicator rectally 3 (three) times daily. 10 g 0  . lactulose (CHRONULAC) 10 GM/15ML solution Take 30 mLs (20 g total) by mouth daily as needed for mild constipation. 120 mL 0  . melatonin 5 MG TABS Take 5 mg by mouth at bedtime.    . Probiotic Product (PROBIOTIC PO) Take 2 capsules by mouth every evening. WITH ASHWAGANDHA     No current facility-administered medications on file prior to visit.    Objective:     Vitals:   06/27/20 1524  BP: 118/80  Pulse: 91     Abdomen: Soft.  Non-tender.  No masses.  No HSM.  Incision/s: Intact.  Healing well.  No erythema.  No drainage.   Abdomen: Soft.  Non-tender.  No masses.  No HSM.  Incision/s: Intact.  Healing well.  No erythema.  No drainage.    Pelvic:   Vulva: Normal appearance.  No lesions.  Vagina: No lesions or abnormalities noted.  Support: Normal pelvic support.  Urethra No masses tenderness or scarring.  Meatus Normal size without lesions or prolapse  Vag Cuff: Intact.  No lesions.  Anus: Normal exam.  No lesions.  Perineum: Normal exam.  No lesions.        Bimanual   Adnexae: No masses.  Non-tender to palpation.  Cuff: Negative for abnormality.      Assessment:    [id number] Patient Active Problem List   Diagnosis Date Noted  . 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 Ovarian cyst 03/07/2020     1. Post-operative state     Patient doing very well!  Estrogen dose may be a little too low based on hot flashes and night sweats   Plan:            1.  May resume normal activities.  May resume intercourse in 2 weeks.  2.  Increase Estrace to 1-1/2 tablets daily.  Orders No orders of the defined types were placed in this encounter.   No orders of the defined types were placed in this encounter.     F/U  Return in about 3 months (around 09/27/2020).  Finis Bud, M.D. 06/27/2020 3:47 PM

## 2020-06-29 ENCOUNTER — Other Ambulatory Visit: Payer: Self-pay | Admitting: Obstetrics and Gynecology

## 2020-10-03 ENCOUNTER — Ambulatory Visit (INDEPENDENT_AMBULATORY_CARE_PROVIDER_SITE_OTHER): Payer: Self-pay | Admitting: Obstetrics and Gynecology

## 2020-10-03 ENCOUNTER — Encounter: Payer: Self-pay | Admitting: Obstetrics and Gynecology

## 2020-10-03 ENCOUNTER — Other Ambulatory Visit: Payer: Self-pay

## 2020-10-03 VITALS — BP 119/79 | HR 83 | Ht 67.0 in | Wt 205.0 lb

## 2020-10-03 DIAGNOSIS — F321 Major depressive disorder, single episode, moderate: Secondary | ICD-10-CM

## 2020-10-03 DIAGNOSIS — N951 Menopausal and female climacteric states: Secondary | ICD-10-CM

## 2020-10-03 MED ORDER — LEVONORGEST-ETH ESTRAD 91-DAY 0.15-0.03 &0.01 MG PO TABS: 1.0000 | ORAL_TABLET | Freq: Every day | ORAL | 1 refills | Status: AC

## 2020-10-03 NOTE — Progress Notes (Signed)
HPI:      [name] is a 44 y.o. [id number] who LMP was [name] was 05/02/2020.  Subjective:   She presents today for postop follow-up of LAVH BSO for complex ovarian cyst and subsequent hormone replacement.  She has increased her ERT to 1-1/2 pills daily.  She states she continues to have occasional hot flashes. Of more significant note she has regrets regarding her hysterectomy.  She states that she no longer feels like a woman.  She says that her sex drive is completely gone, orgasm is not the same.  In addition she says the holidays are always difficult for her because she lost a family member around this time and that in general it is not a time of celebration for her.  She does describe a previous history of depression and postpartum depression.  She states that medications do not work for her and in fact often exacerbate the problem.  She has had counseling before. She does report that her wife is supportive and that the issue is not with her relationship.    Hx: The following portions of the patient's history were reviewed and updated as appropriate:             She  has a past medical history of Anxiety, Depression, Family history of adverse reaction to anesthesia, Fatigue, Heavy periods, Hemorrhoids, History of kidney stones, IBS (irritable bowel syndrome), and Ovarian cyst. She does not have any pertinent problems on file. She  has a past surgical history that includes Rhinoplasty; Tubal ligation; Tonsillectomy; and Laparoscopic vaginal hysterectomy with salpingo oophorectomy (Bilateral, 05/07/2020). Her family history includes Cancer in her mother and paternal aunt; Heart disease in her maternal grandmother. She  reports that she quit smoking about 7 months ago. Her smoking use included cigarettes. She has a 2.50 pack-year smoking history. She has never used smokeless tobacco. She reports current alcohol use. She reports that she does not use drugs. She has a  current medication list which includes the following prescription(s): apple cider vinegar, black elderberry, cyanocobalamin, estradiol, estroven menopause & weight, hydrocortisone, hydrocortisone-pramoxine, lactulose, melatonin, and probiotic product. She is allergic to aspirin.       Review of Systems:  Review of Systems  Constitutional: Denied constitutional symptoms, night sweats, recent illness, fatigue, fever, insomnia and weight loss.  Eyes: Denied eye symptoms, eye pain, photophobia, vision change and visual disturbance.  Ears/Nose/Throat/Neck: Denied ear, nose, throat or neck symptoms, hearing loss, nasal discharge, sinus congestion and sore throat.  Cardiovascular: Denied cardiovascular symptoms, arrhythmia, chest pain/pressure, edema, exercise intolerance, orthopnea and palpitations.  Respiratory: Denied pulmonary symptoms, asthma, pleuritic pain, productive sputum, cough, dyspnea and wheezing.  Gastrointestinal: Denied, gastro-esophageal reflux, melena, nausea and vomiting.  Genitourinary: Denied genitourinary symptoms including symptomatic vaginal discharge, pelvic relaxation issues, and urinary complaints.  Musculoskeletal: Denied musculoskeletal symptoms, stiffness, swelling, muscle weakness and myalgia.  Dermatologic: Denied dermatology symptoms, rash and scar.  Neurologic: Denied neurology symptoms, dizziness, headache, neck pain and syncope.  Psychiatric: Denied psychiatric symptoms, anxiety and depression.  Endocrine: Denied endocrine symptoms including hot flashes and night sweats.   Meds:   Current Outpatient Medications on File Prior to Visit  Medication Sig Dispense Refill  . APPLE CIDER VINEGAR PO Take 2 tablets by mouth 3 (three) times daily after meals. GUMMIES    . BLACK ELDERBERRY PO Take 50 mg by mouth at bedtime.    . cyanocobalamin (,VITAMIN B-12,) 1000 MCG/ML injection 1ML q monthly (Patient taking differently: Inject  1,000 mcg into the muscle every 30 (thirty)  days. ) 1 mL 6  . estradiol (ESTRACE) 1 MG tablet TAKE 1 TABLET BY MOUTH EVERY DAY 90 tablet 1  . ESTROVEN MENOPAUSE & WEIGHT 40-56-300 MG CAPS Take 1 tablet by mouth at bedtime.    . hydrocortisone (ANUSOL-HC) 2.5 % rectal cream PLACE 1 APPLICATION RECTALLY 2 (TWO) TIMES DAILY. (Patient taking differently: Place 1 application rectally 2 (two) times daily as needed for hemorrhoids or anal itching. ) 28.35 g 1  . hydrocortisone-pramoxine (PROCTOFOAM-HC) rectal foam Place 1 applicator rectally 3 (three) times daily. 10 g 0  . lactulose (CHRONULAC) 10 GM/15ML solution Take 30 mLs (20 g total) by mouth daily as needed for mild constipation. 120 mL 0  . melatonin 5 MG TABS Take 5 mg by mouth at bedtime.    . Probiotic Product (PROBIOTIC PO) Take 2 capsules by mouth every evening. WITH ASHWAGANDHA     No current facility-administered medications on file prior to visit.          Objective:     Vitals:   10/03/20 1533  BP: 119/79  Pulse: 83   Filed Weights   10/03/20 1533  Weight: 205 lb (93 kg)                Assessment:    [id number] Patient Active Problem List   Diagnosis Date Noted  . 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 Ovarian cyst 03/07/2020     1. Symptomatic menopausal or female climacteric states   2. Current moderate episode of major depressive disorder, unspecified whether recurrent ([place])     I believe that there are several items in play.  She is still not completely physically recovered from her surgery and this has some influence on how she feels.  She also has some amount of depression that is affecting her outlook on the surgery, her outlook on life the holidays etc.  Finally she probably continues to have some amount of hormonal effect with her ovaries being removed and her continuation of hot flashes.   Plan:            1.  We will increase her estrogen by beginning OCPs.  Patient has elected to try this rather than increasing her current HRT dose  2.  I have  advised counseling and possibly medication and the patient has agreed to seek counseling.  Multiple sources of income based counseling given.  3.  Close follow-up after approximately 6 weeks.  Patient instructed to call if she gets worse before that time and we will see her. Orders No orders of the defined types were placed in this encounter.   No orders of the defined types were placed in this encounter.     F/U  No follow-ups on file. I spent 35 minutes involved in the care of this patient preparing to see the patient by obtaining and reviewing her medical history (including labs, imaging tests and prior procedures), documenting clinical information in the electronic health record (EHR), counseling and coordinating care plans, writing and sending prescriptions, ordering tests or procedures and directly communicating with the patient by discussing pertinent items from her history and physical exam as well as detailing my assessment and plan as noted above so that she has an informed understanding.  All of her questions were answered.  Finis Bud, M.D. 10/03/2020 4:27 PM

## 2020-10-15 ENCOUNTER — Emergency Department
Admission: EM | Admit: 2020-10-15 | Discharge: 2020-10-15 | Disposition: A | Discharge status: Home / Self Care | Payer: Medicaid Other | Attending: Emergency Medicine | Admitting: Emergency Medicine

## 2020-10-15 ENCOUNTER — Other Ambulatory Visit: Payer: Self-pay

## 2020-10-15 DIAGNOSIS — R1033 Periumbilical pain: Secondary | ICD-10-CM | POA: Insufficient documentation

## 2020-10-15 DIAGNOSIS — Z87891 Personal history of nicotine dependence: Secondary | ICD-10-CM | POA: Insufficient documentation

## 2020-10-15 LAB — COMPREHENSIVE METABOLIC PANEL
ALT: 31 U/L (ref 0–44)
AST: 22 U/L (ref 15–41)
Albumin: 4.1 g/dL (ref 3.5–5.0)
Alkaline Phosphatase: 55 U/L (ref 38–126)
Anion gap: 7 (ref 5–15)
BUN: 17 mg/dL (ref 6–20)
CO2: 27 mmol/L (ref 22–32)
Calcium: 9 mg/dL (ref 8.9–10.3)
Chloride: 103 mmol/L (ref 98–111)
Creatinine, Ser: 0.82 mg/dL (ref 0.44–1.00)
GFR, Estimated: >60 mL/min (ref >60)
Glucose, Bld: 100 mg/dL — ABNORMAL HIGH (ref 70–99)
Potassium: 4.3 mmol/L (ref 3.5–5.1)
Sodium: 137 mmol/L (ref 135–145)
Total Bilirubin: 0.5 mg/dL (ref 0.3–1.2)
Total Protein: 7.1 g/dL (ref 6.5–8.1)

## 2020-10-15 LAB — URINALYSIS, COMPLETE (UACMP) WITH MICROSCOPIC
Bilirubin Urine: NEGATIVE
Glucose, UA: NEGATIVE mg/dL
Hgb urine dipstick: NEGATIVE
Ketones, ur: NEGATIVE mg/dL
Leukocytes,Ua: NEGATIVE
Nitrite: NEGATIVE
Protein, ur: NEGATIVE mg/dL
Specific Gravity, Urine: 1.011 (ref 1.005–1.030)
pH: 7 (ref 5.0–8.0)

## 2020-10-15 LAB — CBC
HCT: 38.4 % (ref 36.0–46.0)
Hemoglobin: 12.5 g/dL (ref 12.0–15.0)
MCH: 28.1 pg (ref 26.0–34.0)
MCHC: 32.6 g/dL (ref 30.0–36.0)
MCV: 86.3 fL (ref 80.0–100.0)
Platelets: 300 10*3/uL (ref 150–400)
RBC: 4.45 MIL/uL (ref 3.87–5.11)
RDW: 12.8 % (ref 11.5–15.5)
WBC: 7.2 10*3/uL (ref 4.0–10.5)
nRBC: 0 % (ref 0.0–0.2)

## 2020-10-15 LAB — LIPASE, BLOOD: Lipase: 23 U/L (ref 11–51)

## 2020-10-15 NOTE — ED Triage Notes (Signed)
Pt to ED POV for chief complaint of abdominal pain to mid abdomen since Saturday that worsened last night.  States stomach feels "full" Denies N/V, fevers Last BM this morning  States had a hysterectomy less than 6 months ago  Hx ovarian cancer Pain worsens with eating

## 2020-10-15 NOTE — ED Notes (Signed)
Pt reports she had a hysterectomy in June and picked dog up five days ago and had slight abdominal pain. Pt reports pain got worse after Thanksgiving (4 days ago) and she c/o abdominal distention and constipation. Pt states last BM was this morning- was small amount and difficult to pass. Abdomen is round, distended, soft to palpation, no tenderness noted. Pt also reports history of kidney stones.

## 2020-10-15 NOTE — ED Provider Notes (Signed)
[facility] Emergency Department Provider Note   ____________________________________________    I have reviewed the triage vital signs and the nursing notes.   HISTORY  Chief Complaint Abdominal Pain     HPI Martha Freeman is a 44 y.o. female who presents with complaints of mild abdominal discomfort periumbilically.  Patient reports she had a hysterectomy less than 6 months ago, over the holiday weekend she has been exerting herself and lifting heavy objects.  She is concerned that she may have caused a hernia because she felt a fullness in her central abdomen today.  Currently feels well and has no pain or discomfort.  Denies dysuria no fevers chills nausea vomiting.  Normal stools.  Has not take anything for this  Past Medical History:  Diagnosis Date  . Anxiety   . Depression    post partum  . Family history of adverse reaction to anesthesia    seizure with morphine  . Fatigue   . Heavy periods   . Hemorrhoids   . History of kidney stones   . IBS (irritable bowel syndrome)   . Ovarian cyst    both sides    Patient Active Problem List   Diagnosis Date Noted  . 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 Ovarian cyst 03/07/2020    Past Surgical History:  Procedure Laterality Date  . LAPAROSCOPIC VAGINAL HYSTERECTOMY WITH SALPINGO OOPHORECTOMY Bilateral 05/07/2020   Procedure: LAPAROSCOPIC ASSISTED VAGINAL HYSTERECTOMY WITH SALPINGO OOPHORECTOMY;  Surgeon: Harlin Heys, MD;  Location: [facility] ORS;  Service: Gynecology;  Laterality: Bilateral;  . RHINOPLASTY    . TONSILLECTOMY    . TUBAL LIGATION      Prior to Admission medications   Medication Sig Start Date End Date Taking? Authorizing Provider  APPLE CIDER VINEGAR PO Take 2 tablets by mouth 3 (three) times daily after meals. GUMMIES    [provider]  BLACK ELDERBERRY PO Take 50 mg by mouth at bedtime.    [provider]  cyanocobalamin (,VITAMIN B-12,) 1000 MCG/ML  injection 1ML q monthly Patient taking differently: Inject 1,000 mcg into the muscle every 30 (thirty) days.  03/30/19   Shambley, Melody N, CNM  estradiol (ESTRACE) 1 MG tablet TAKE 1 TABLET BY MOUTH EVERY DAY 06/29/20   Harlin Heys, MD  ESTROVEN MENOPAUSE & WEIGHT 40-56-300 MG CAPS Take 1 tablet by mouth at bedtime.    [provider]  hydrocortisone (ANUSOL-HC) 2.5 % rectal cream PLACE 1 APPLICATION RECTALLY 2 (TWO) TIMES DAILY. Patient taking differently: Place 1 application rectally 2 (two) times daily as needed for hemorrhoids or anal itching.  09/09/19   Shambley, Melody N, CNM  hydrocortisone-pramoxine (PROCTOFOAM-HC) rectal foam Place 1 applicator rectally 3 (three) times daily. 03/03/20   Paulette Blanch, MD  lactulose (CHRONULAC) 10 GM/15ML solution Take 30 mLs (20 g total) by mouth daily as needed for mild constipation. 03/03/20   Paulette Blanch, MD  Levonorgestrel-Ethinyl Estradiol (AMETHIA) 0.15-0.03 &0.01 MG tablet Take 1 tablet by mouth at bedtime. 10/03/20   Harlin Heys, MD  melatonin 5 MG TABS Take 5 mg by mouth at bedtime.    [provider]  Probiotic Product (PROBIOTIC PO) Take 2 capsules by mouth every evening. WITH ASHWAGANDHA    [provider]     Allergies Aspirin  Family History  Problem Relation Age of Onset  . Cancer Mother        ENDOMETRIAL CANCER  . Cancer Paternal Aunt  BREAST  . Heart disease Maternal Grandmother   . Breast cancer Neg Hx   . Ovarian cancer Neg Hx     Social History Social History   Tobacco Use  . Smoking status: Former Smoker    Packs/day: 0.25    Years: 10.00    Pack years: 2.50    Types: Cigarettes    Quit date: 03/03/2020    Years since quitting: 0.6  . Smokeless tobacco: Never Used  Vaping Use  . Vaping Use: Every day  . Substances: Nicotine, CBD, Flavoring  Substance Use Topics  . Alcohol use: Yes    Alcohol/week: 0.0 standard drinks    Comment: occasional   . Drug use: No     Review of Systems  Constitutional: No fever/chills Eyes: No visual changes.  ENT: No sore throat. Cardiovascular: Denies chest pain. Respiratory: Denies shortness of breath. Gastrointestinal: As above Genitourinary: Negative for dysuria. Musculoskeletal: Negative for back pain. Skin: Negative for rash. Neurological: Negative for headaches or weakness   ____________________________________________   PHYSICAL EXAM:  VITAL SIGNS: ED Triage Vitals [10/15/20 1129]  Enc Vitals Group     BP 116/83     Pulse Rate 67     Resp 20     Temp 98.4 F (36.9 C)     Temp Source Oral     SpO2 100 %     Weight 92 kg (202 lb 13.2 oz)     Height 1.702 m (5\' 7" )     Head Circumference      Peak Flow      Pain Score 5     Pain Loc      Pain Edu?      Excl. in [place]?     Constitutional: Alert and oriented. No acute distress. Pleasant and interactive  Nose: No congestion/rhinnorhea. Mouth/Throat: Mucous membranes are moist.   Neck:  Painless ROM Cardiovascular: Normal rate, regular rhythm. Grossly normal heart sounds.  Good peripheral circulation. Respiratory: Normal respiratory effort.  No retractions. Lungs CTAB. Gastrointestinal: Soft and nontender. No distention.  No CVA tenderness.  Reassuring exam, no hernia palpated  Musculoskeletal  Warm and well perfused Neurologic:  Normal speech and language. No gross focal neurologic deficits are appreciated.  Skin:  Skin is warm, dry and intact. No rash noted. Psychiatric: Mood and affect are normal. Speech and behavior are normal.  ____________________________________________   LABS (all labs ordered are listed, but only abnormal results are displayed)  Labs Reviewed  COMPREHENSIVE METABOLIC PANEL - Abnormal; Notable for the following components:      Result Value   Glucose, Bld 100 (*)    All other components within normal limits  URINALYSIS, COMPLETE (UACMP) WITH MICROSCOPIC - Abnormal; Notable for the following components:    Color, Urine YELLOW (*)    APPearance HAZY (*)    Bacteria, UA RARE (*)    All other components within normal limits  LIPASE, BLOOD  CBC   ____________________________________________  EKG  ED ECG REPORT I, Lavonia Drafts, the attending physician, personally viewed and interpreted this ECG.  Date: 10/15/2020  Rhythm: normal sinus rhythm QRS Axis: normal Intervals: normal ST/T Wave abnormalities: normal Narrative Interpretation: no evidence of acute ischemia  ____________________________________________  RADIOLOGY   ____________________________________________   PROCEDURES  Procedure(s) performed: No  Procedures   Critical Care performed: No ____________________________________________   INITIAL IMPRESSION / ASSESSMENT AND PLAN / ED COURSE  Pertinent labs & imaging results that were available during my care of the patient were reviewed  by me and considered in my medical decision making (see chart for details).  Patient presents with concerns as above.  Differential includes muscle strain, hernia, gastritis.  Lab work is overall unremarkable, normal lipase CMP and CBC  No tenderness on exam, no hernia palpated.  Recommend supportive care, outpatient follow-up as needed.  No indication for imaging at this time.    ____________________________________________   FINAL CLINICAL IMPRESSION(S) / ED DIAGNOSES  Final diagnoses:  Periumbilical abdominal pain        Note:  This document was prepared using Dragon voice recognition software and may include unintentional dictation errors.   Lavonia Drafts, MD 10/15/20 1525

## 2020-10-18 IMAGING — US US PELVIS COMPLETE TRANSABD/TRANSVAG W DUPLEX
1 series · 13 of 25 positions shown · non-contrast
Comparison: Ultrasound and CT from 02/20/2020

CLINICAL DATA: Pelvic pain

EXAM:
TRANSABDOMINAL AND TRANSVAGINAL ULTRASOUND OF PELVIS
DOPPLER ULTRASOUND OF OVARIES
TECHNIQUE: Both transabdominal and transvaginal ultrasound examinations of the
pelvis were performed. Transabdominal technique was performed for
global imaging of the pelvis including uterus, ovaries, adnexal
regions, and pelvic cul-de-sac.
It was necessary to proceed with endovaginal exam following the
transabdominal exam to visualize the ovaries. Color and duplex
Doppler ultrasound was utilized to evaluate blood flow to the
ovaries.

[Series 1: gyn us · 13 of 85 slices shown]
[im 1/85]
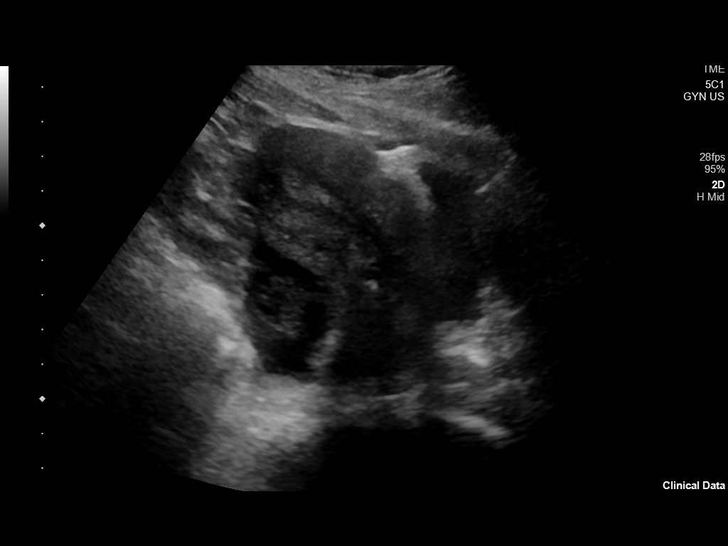
[im 8/85]
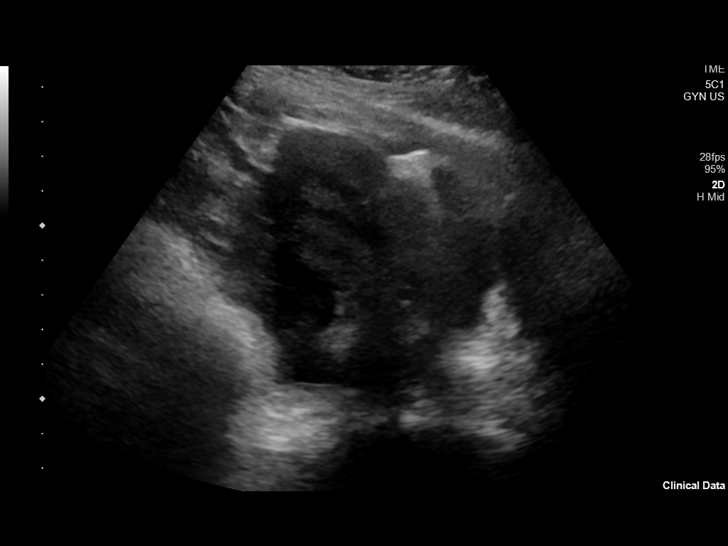
[im 15/85]
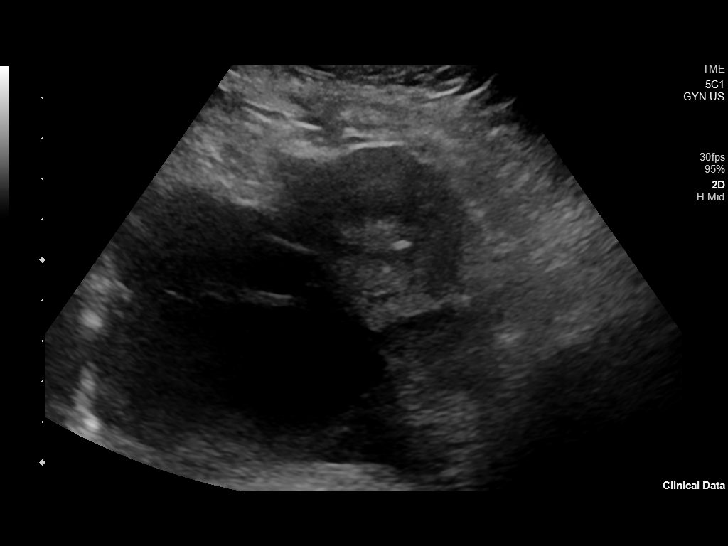
[im 22/85]
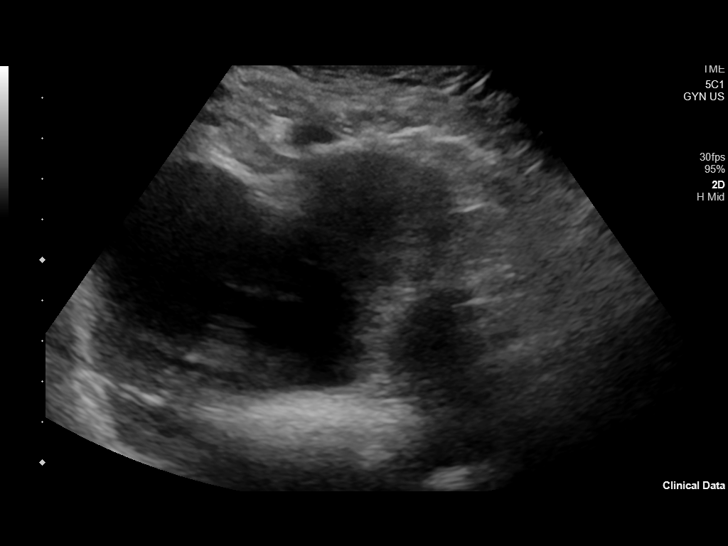
[im 29/85]
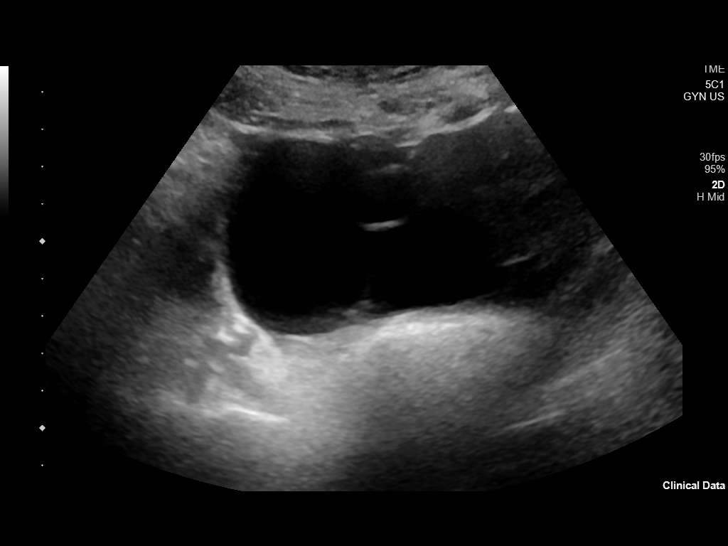
[im 36/85]
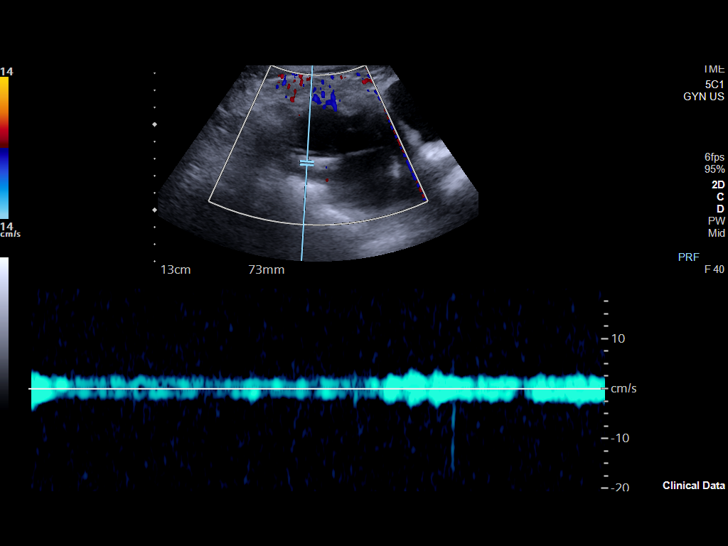
[im 43/85]
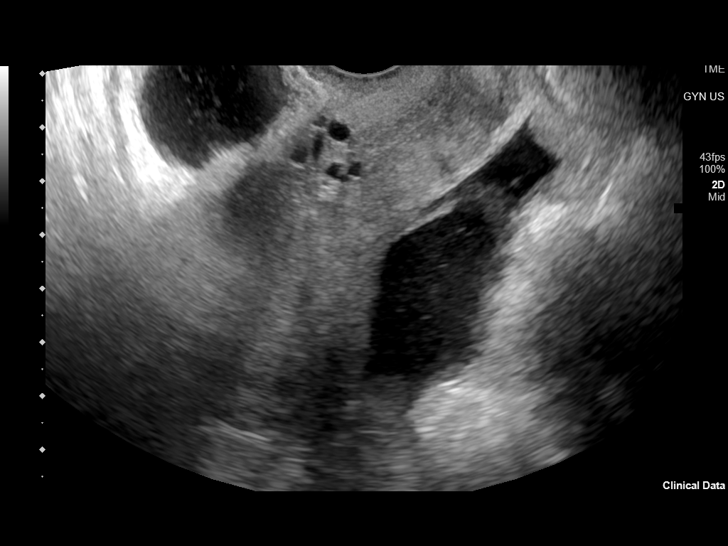
[im 50/85]
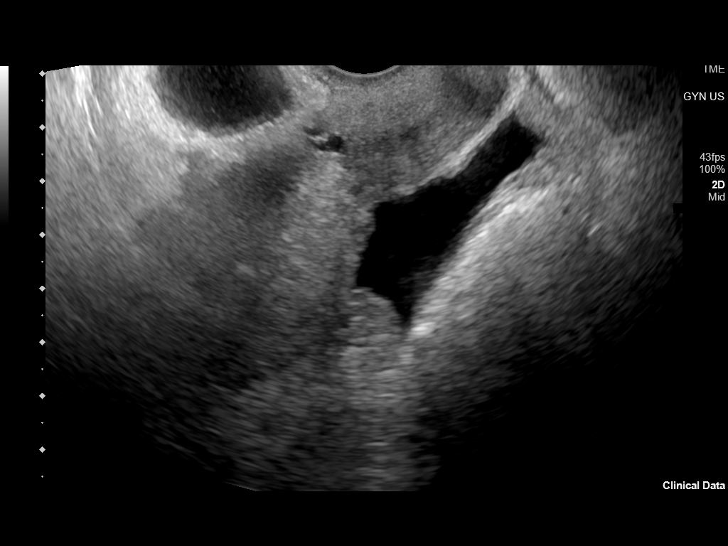
[im 57/85]
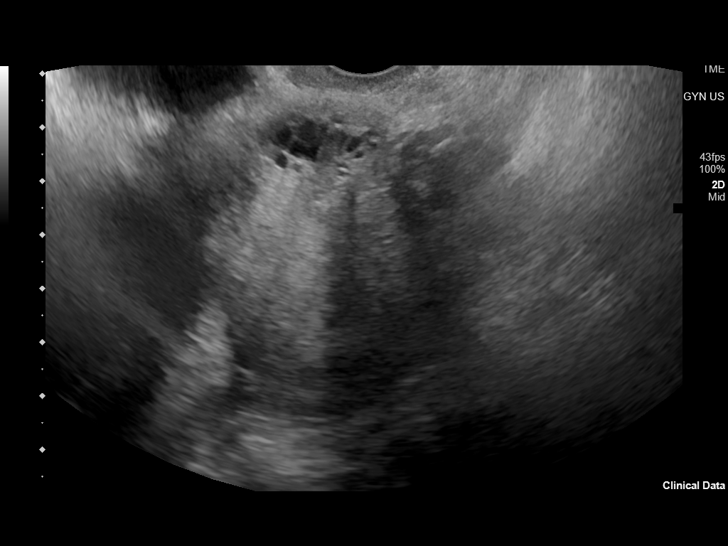
[im 64/85]
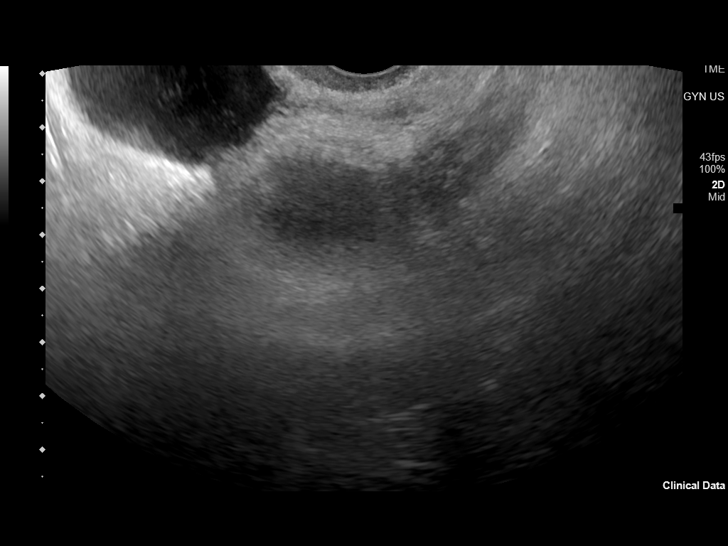
[im 71/85]
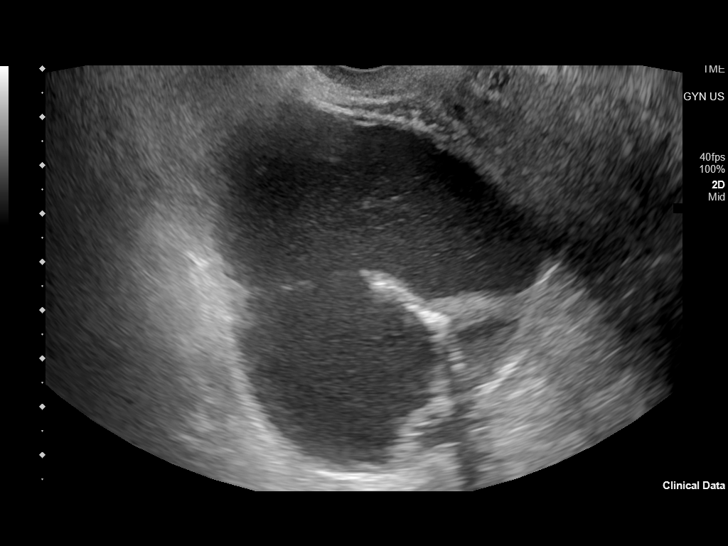
[im 78/85]
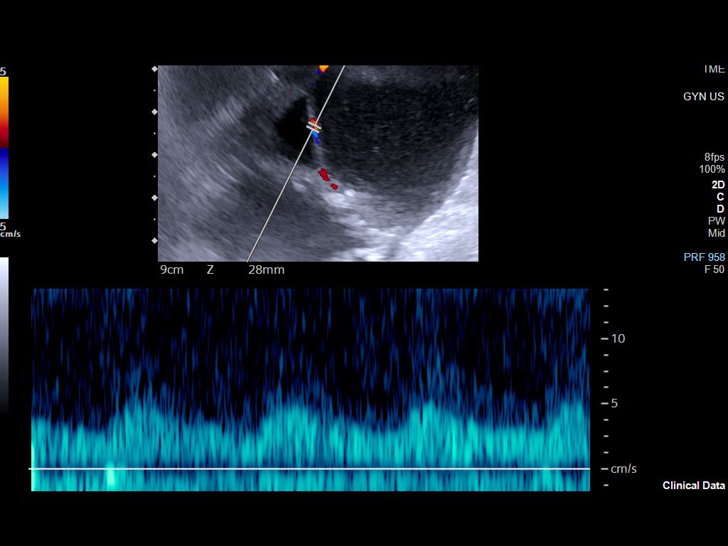
[im 85/85]
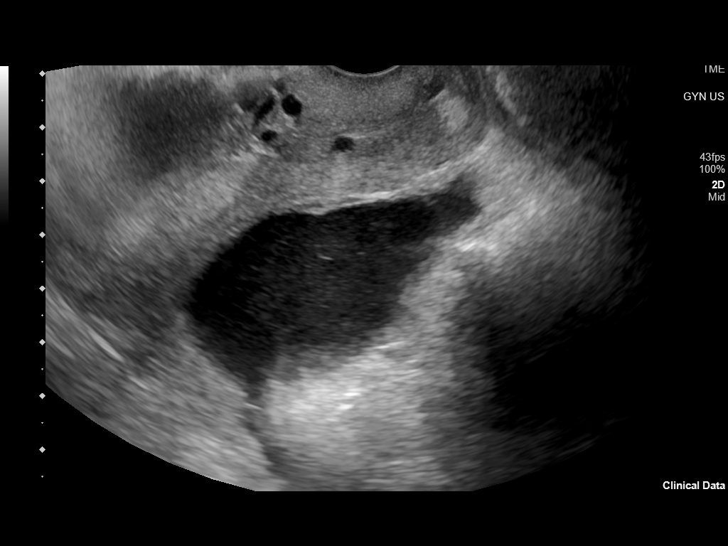

[13 of 25 positions shown; findings below may reference images not displayed]

FINDINGS: Uterus

Measurements: 8.7 x 3.8 x 4.9 cm. = volume: 85 mL. No fibroids or
other mass visualized. Multiple nabothian cysts are seen.

Endometrium

Thickness: 9.7 mm.  No focal abnormality visualized.

Right ovary

Measurements: 8.1 x 6.9 x 7.6 cm. = volume: 221 mL. Persistent
septated cystic mass is noted within the right ovary stable from the
prior exam. It measures approximately 8.6 cm in greatest dimension.
Blood flow is noted within the septations and this is highly
suspicious for ovarian neoplasm.

Left ovary

Not well visualized on today's exam.

Pulsed Doppler evaluation of both ovaries demonstrates normal
low-resistance arterial and venous waveforms.

Other findings

Mild free fluid is noted.
IMPRESSION: Persistent septated ovarian cystic mass is noted on the right
measuring up to 8.6 cm with irregular septations within and
vascularity within the septations. This is again suspicious for
ovarian neoplasm and gynecologic surgical evaluation is recommended.

Left ovary is not well appreciated on today's exam.

By history, the patient had a known distal right ureteral stone in
this may contribute to the patient's underlying discomfort.

## 2020-10-31 IMAGING — US US RENAL
1 series · 14 of 25 positions shown · non-contrast
Comparison: CT abdomen dated 02/20/2020.

CLINICAL DATA: Kidney stones.

EXAM:
RENAL / URINARY TRACT ULTRASOUND COMPLETE

[Series 1: us renal · 14 of 55 slices shown]
[im 1/55]
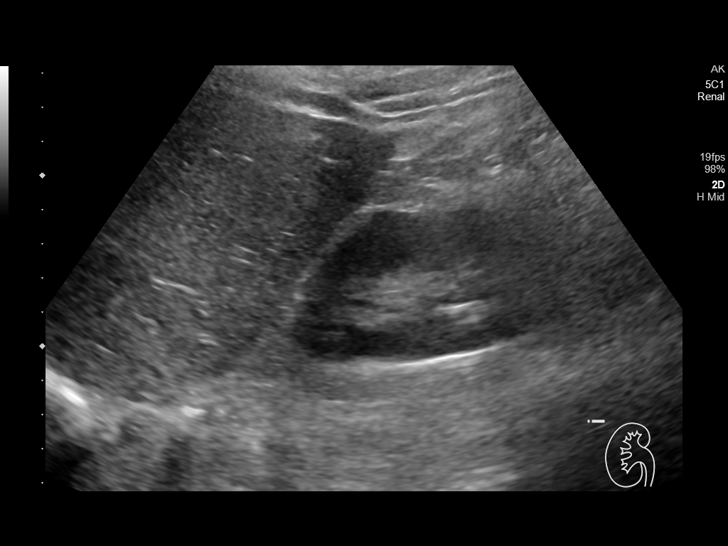
[im 5/55]
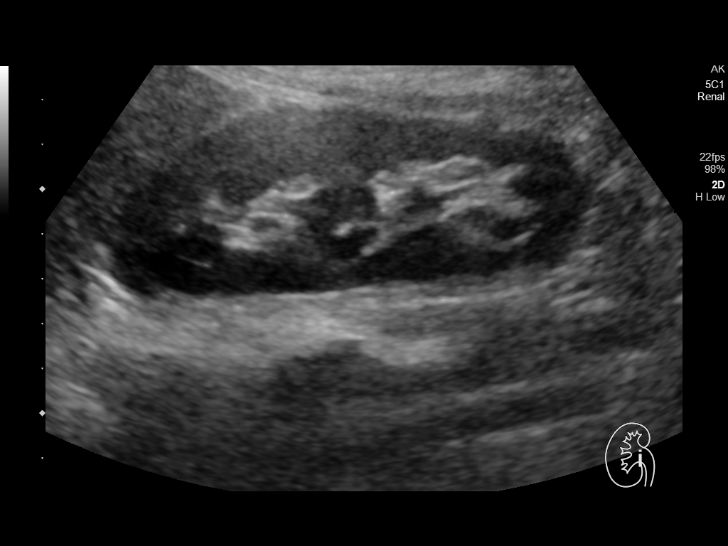
[im 10/55]
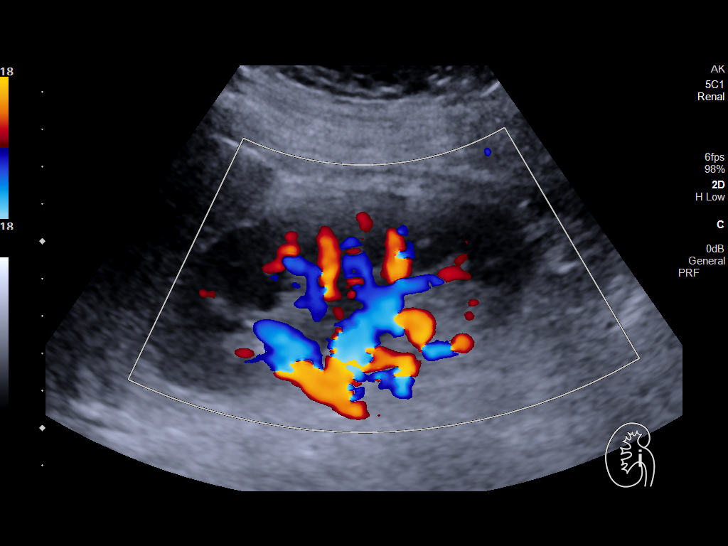
[im 14/55]
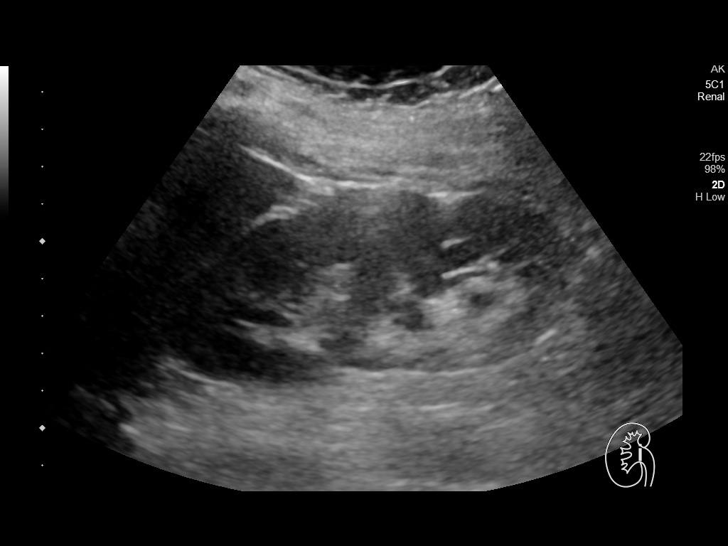
[im 19/55]
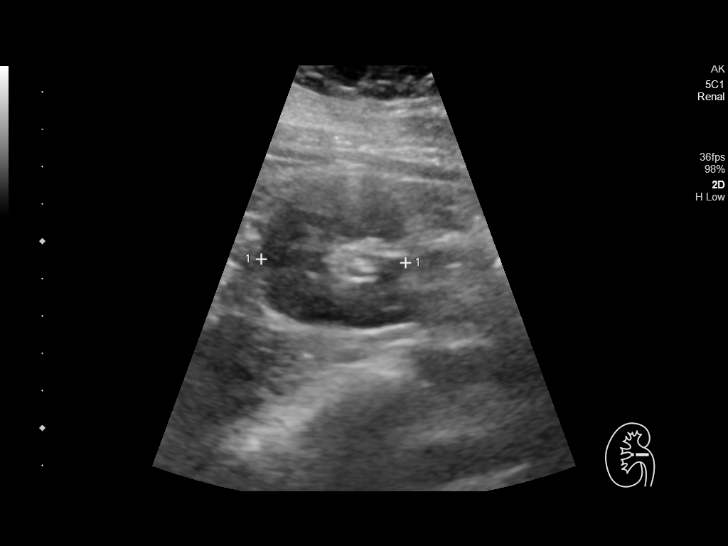
[im 21/55]
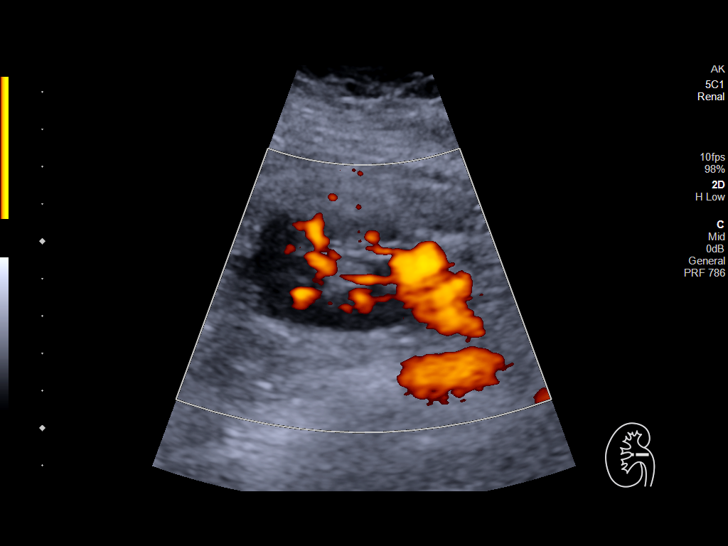
[im 25/55]
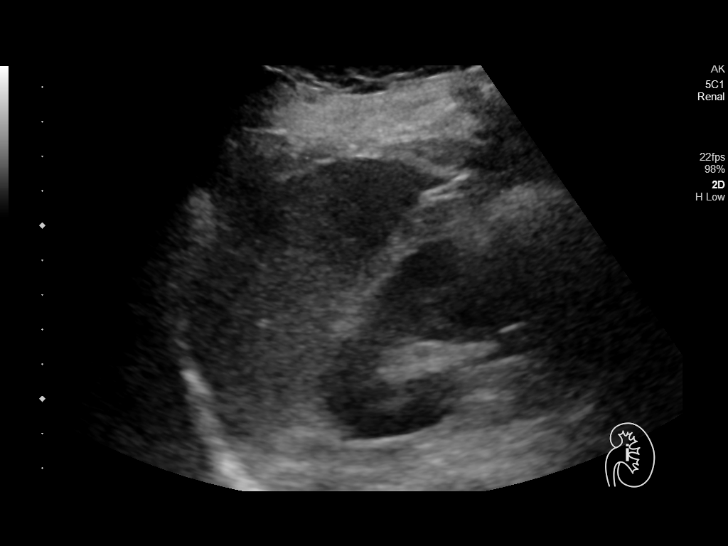
[im 30/55]
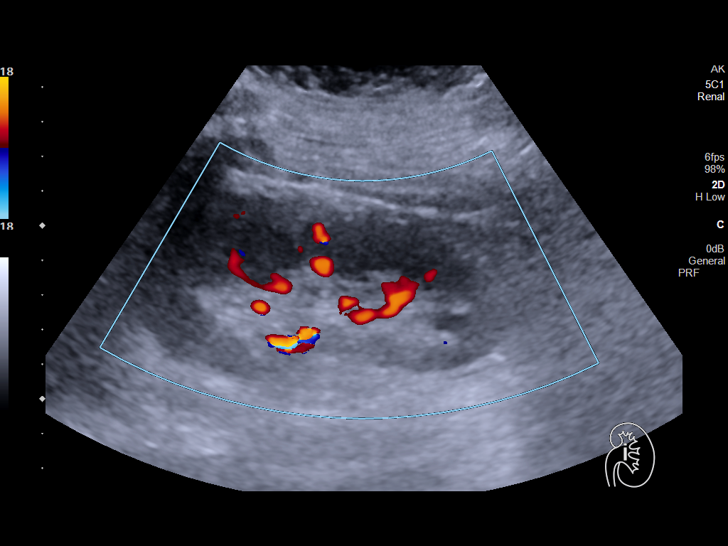
[im 34/55]
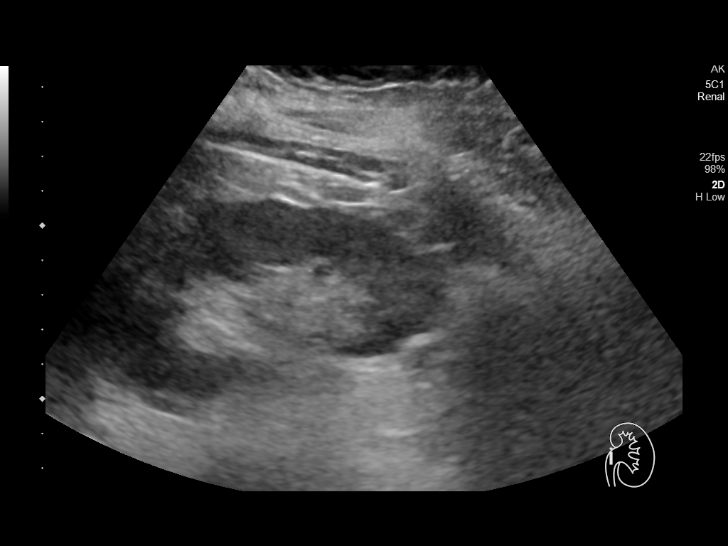
[im 37/55]
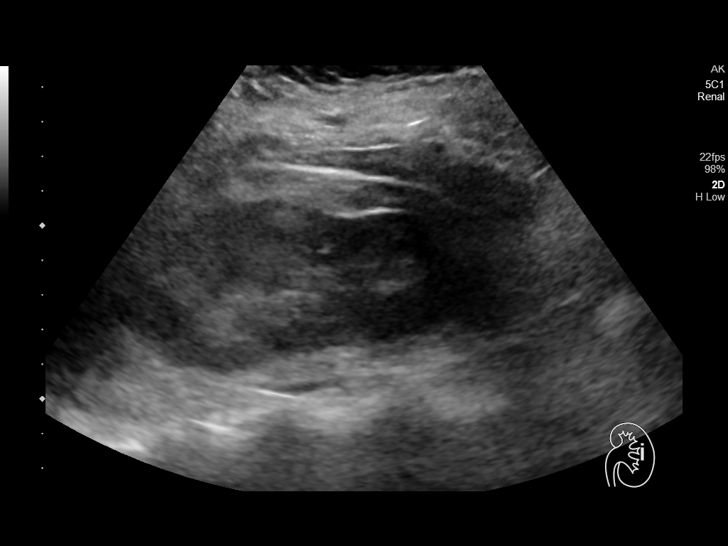
[im 41/55]
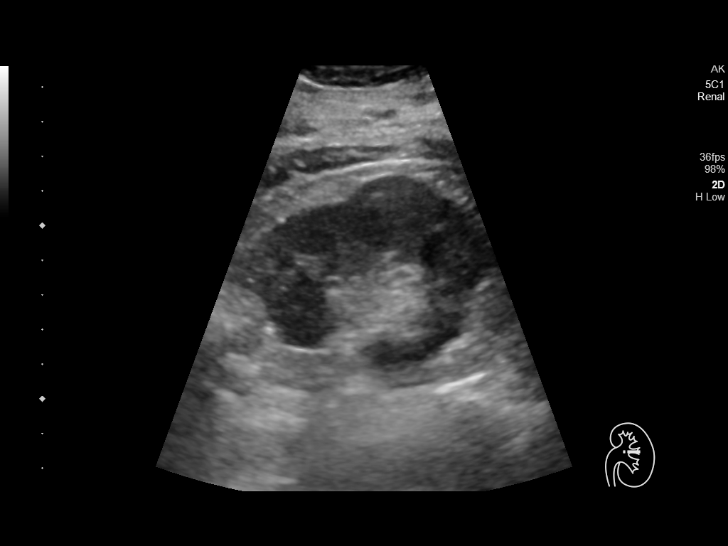
[im 46/55]
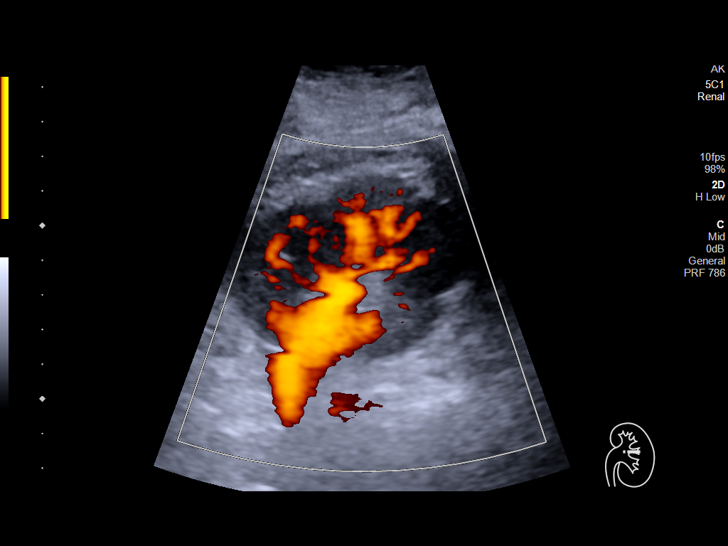
[im 50/55]
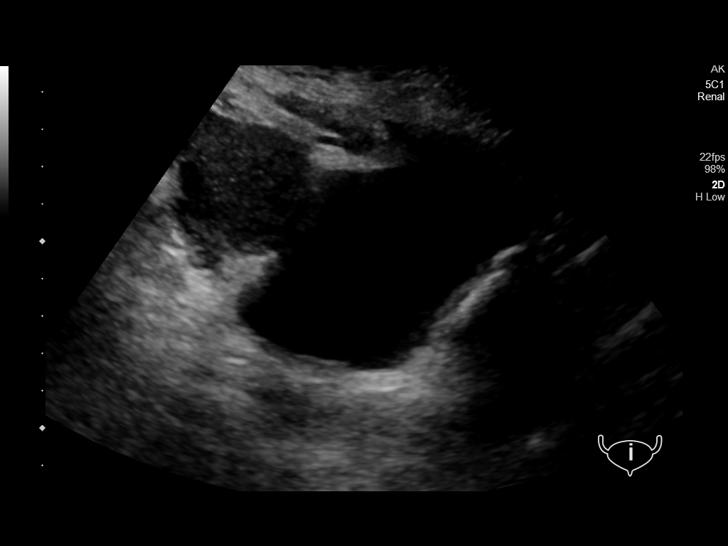
[im 55/55]
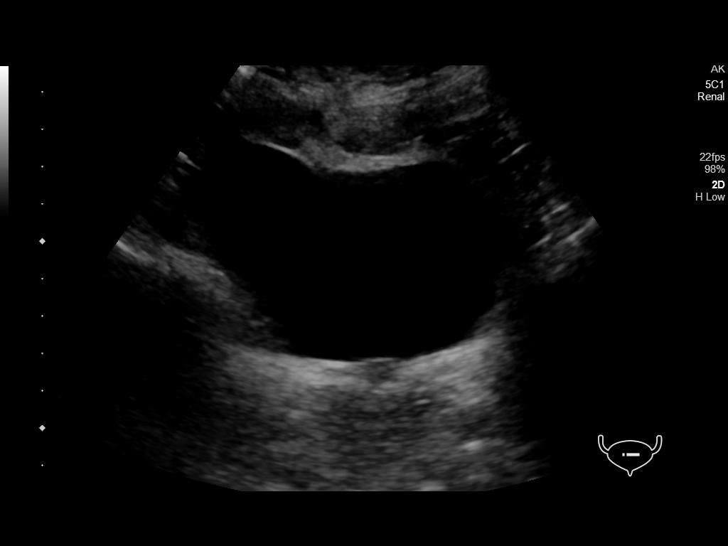

[14 of 25 positions shown; findings below may reference images not displayed]

FINDINGS: Right Kidney:

Renal measurements: 10.7 x 4.5 x 3.9 cm = volume: 94 mL. RIGHT renal
cortex thickness and echogenicity is within normal limits. No
hydronephrosis.

Left Kidney:

Renal measurements: 11.1 x 5.5 x 5.5 cm = volume: 175 mL. LEFT renal
cortex thickness and echogenicity is within normal limits. No
hydronephrosis.

Bladder:

Appears normal for degree of bladder distention.

Other:

None.
IMPRESSION: 1. Kidneys appear normal.  No hydronephrosis.
2. Bladder appears normal.

## 2020-11-16 ENCOUNTER — Other Ambulatory Visit: Payer: Self-pay | Admitting: Obstetrics and Gynecology

## 2020-11-19 ENCOUNTER — Encounter: Payer: Medicaid Other | Admitting: Obstetrics and Gynecology

## 2021-03-19 ENCOUNTER — Other Ambulatory Visit: Payer: Self-pay | Admitting: Obstetrics and Gynecology

## 2022-01-14 ENCOUNTER — Other Ambulatory Visit: Payer: Self-pay | Admitting: Obstetrics and Gynecology

## 2022-07-18 ENCOUNTER — Other Ambulatory Visit: Payer: Self-pay | Admitting: Obstetrics and Gynecology

## 2022-07-18 ENCOUNTER — Emergency Department
Admission: EM | Admit: 2022-07-18 | Discharge: 2022-07-18 | Disposition: A | Discharge status: Home / Self Care | Payer: BC Managed Care – PPO | Attending: Emergency Medicine | Admitting: Emergency Medicine

## 2022-07-18 ENCOUNTER — Other Ambulatory Visit: Payer: Self-pay

## 2022-07-18 DIAGNOSIS — R109 Unspecified abdominal pain: Secondary | ICD-10-CM | POA: Diagnosis not present

## 2022-07-18 DIAGNOSIS — U071 COVID-19: Secondary | ICD-10-CM | POA: Insufficient documentation

## 2022-07-18 DIAGNOSIS — R197 Diarrhea, unspecified: Secondary | ICD-10-CM

## 2022-07-18 LAB — COMPREHENSIVE METABOLIC PANEL
ALT: 52 U/L — ABNORMAL HIGH (ref 0–44)
AST: 29 U/L (ref 15–41)
Albumin: 4.6 g/dL (ref 3.5–5.0)
Alkaline Phosphatase: 78 U/L (ref 38–126)
Anion gap: 8 (ref 5–15)
BUN: 14 mg/dL (ref 6–20)
CO2: 24 mmol/L (ref 22–32)
Calcium: 9.8 mg/dL (ref 8.9–10.3)
Chloride: 107 mmol/L (ref 98–111)
Creatinine, Ser: 0.87 mg/dL (ref 0.44–1.00)
GFR, Estimated: >60 mL/min (ref >60)
Glucose, Bld: 121 mg/dL — ABNORMAL HIGH (ref 70–99)
Potassium: 4 mmol/L (ref 3.5–5.1)
Sodium: 139 mmol/L (ref 135–145)
Total Bilirubin: 0.3 mg/dL (ref 0.3–1.2)
Total Protein: 8.7 g/dL — ABNORMAL HIGH (ref 6.5–8.1)

## 2022-07-18 LAB — CBC
HCT: 40.1 % (ref 36.0–46.0)
Hemoglobin: 12.7 g/dL (ref 12.0–15.0)
MCH: 26.8 pg (ref 26.0–34.0)
MCHC: 31.7 g/dL (ref 30.0–36.0)
MCV: 84.6 fL (ref 80.0–100.0)
Platelets: 367 10*3/uL (ref 150–400)
RBC: 4.74 MIL/uL (ref 3.87–5.11)
RDW: 13.5 % (ref 11.5–15.5)
WBC: 8.9 10*3/uL (ref 4.0–10.5)
nRBC: 0 % (ref 0.0–0.2)

## 2022-07-18 LAB — LIPASE, BLOOD: Lipase: 28 U/L (ref 11–51)

## 2022-07-18 MED ORDER — SODIUM CHLORIDE 0.9 % IV BOLUS
1000.0000 mL | Freq: Once | INTRAVENOUS | Status: AC
  Administered 2022-07-18: 1000 mL via INTRAVENOUS

## 2022-07-18 MED ORDER — DICYCLOMINE HCL 10 MG PO CAPS: 10.0000 mg | ORAL_CAPSULE | Freq: Three times a day (TID) | ORAL | 0 refills | Status: AC | PRN

## 2022-07-18 MED ORDER — DICYCLOMINE HCL 10 MG PO CAPS
10.0000 mg | ORAL_CAPSULE | Freq: Once | ORAL | Status: AC
  Administered 2022-07-18: 10 mg via ORAL
  Filled 2022-07-18: qty 1

## 2022-07-18 MED ORDER — ONDANSETRON 4 MG PO TBDP: 4.0000 mg | ORAL_TABLET | Freq: Three times a day (TID) | ORAL | 0 refills | Status: AC | PRN

## 2022-07-18 MED ORDER — ONDANSETRON HCL 4 MG/2ML IJ SOLN
4.0000 mg | Freq: Once | INTRAMUSCULAR | Status: AC
  Administered 2022-07-18: 4 mg via INTRAVENOUS
  Filled 2022-07-18: qty 2

## 2022-07-18 NOTE — Discharge Instructions (Addendum)
You may take the Zofran as needed for nausea and the Bentyl if needed for crampy abdominal discomfort and diarrhea.  You may continue to take the Imodium as well if needed.  Follow-up with your regular doctor.  Return to the ER for new, worsening, or persistent severe abdominal pain, fever, vomiting or inability to hold anything down, worsening diarrhea, blood in the stool, or any other new or worsening symptoms that concern you.

## 2022-07-18 NOTE — ED Triage Notes (Signed)
Pt states she has had diarrhea and stomach cramps x1 week- a couple days ago she started having cold symptoms and she tested positive for covid- pt states she spent most of the yesterday on the toilet and everything she eats goes through her- pt states she tried imodium with no relief

## 2022-07-18 NOTE — ED Notes (Signed)
See triage note, pt reports got cold sx and covid last week. Has now had GI sx for over week. NAD noted. Call bell in reach, stretcher locked in lowest position

## 2022-07-18 NOTE — ED Provider Notes (Signed)
[facility] Provider Note    Event Date/Time   First MD Initiated Contact with Patient 07/18/22 0805     (approximate)   History   Diarrhea   HPI  Martha Freeman is a 46 y.o. female with a history of IBS who presents with diarrhea over the last several days, watery and nonbloody, associated with crampy abdominal discomfort as well as with nausea and decreased appetite.  The patient initially had some crampy abdominal discomfort, loose stools, and URI symptoms.  She took a COVID test 5 days ago that was positive.  The respiratory symptoms have resolved but the diarrhea worsened in the last 2 days.  She has not eaten anything out of the ordinary and has not traveled recently.  She denies fever or chills.    Physical Exam   Triage Vital Signs: ED Triage Vitals  Enc Vitals Group     BP 07/18/22 0739 124/88     Pulse Rate 07/18/22 0738 86     Resp 07/18/22 0738 18     Temp 07/18/22 0738 97.7 F (36.5 C)     Temp Source 07/18/22 0738 Oral     SpO2 07/18/22 0738 99 %     Weight 07/18/22 0740 215 lb (97.5 kg)     Height 07/18/22 0740 '5\' 6"'$  (1.676 m)     Head Circumference --      Peak Flow --      Pain Score 07/18/22 0740 10     Pain Loc --      Pain Edu? --      Excl. in [place]? --     Most recent vital signs: Vitals:   07/18/22 1000 07/18/22 1030  BP: 105/71 106/66  Pulse: (!) 52 62  Resp: 18 18  Temp:    SpO2: 98% 98%     General: Alert and oriented, well-appearing. CV:  Good peripheral perfusion.  Resp:  Normal effort.  Abd:  Soft with mild diffuse discomfort but no focal tenderness.  No distention.  Other:  No scleral icterus.  Moist mucous membranes.   ED Results / Procedures / Treatments   Labs (all labs ordered are listed, but only abnormal results are displayed) Labs Reviewed  COMPREHENSIVE METABOLIC PANEL - Abnormal; Notable for the following components:      Result Value   Glucose, Bld 121 (*)    Total Protein 8.7 (*)     ALT 52 (*)    All other components within normal limits  LIPASE, BLOOD  CBC  URINALYSIS, ROUTINE W REFLEX MICROSCOPIC     EKG     RADIOLOGY    PROCEDURES:  Critical Care performed: No  Procedures   MEDICATIONS ORDERED IN ED: Medications  sodium chloride 0.9 % bolus 1,000 mL (0 mLs Intravenous Stopped 07/18/22 1110)  ondansetron (ZOFRAN) injection 4 mg (4 mg Intravenous Given 07/18/22 0849)  dicyclomine (BENTYL) capsule 10 mg (10 mg Oral Given 07/18/22 0850)     IMPRESSION / MDM / ASSESSMENT AND PLAN / ED COURSE  I reviewed the triage vital signs and the nursing notes.  46 year old female with PMH as noted above presents with diarrhea and crampy abdominal pain over the last several days after being diagnosed with COVID earlier in the week.  On exam the patient is well-appearing.  Her vital signs are normal.  The abdomen is soft with no focal tenderness.  Differential diagnosis includes, but is not limited to, COVID-19, other viral gastroenteritis, foodborne illness, IBS flare.  Patient's presentation is most consistent with acute complicated illness / injury requiring diagnostic workup.  We will obtain labs, give fluids, antiemetic, and antispasmodic.  Given the benign abdominal exam there is no indication for imaging.  The patient is on the cardiac monitor to evaluate for evidence of arrhythmia and/or significant heart rate changes.  ----------------------------------------- 11:22 AM on 07/18/2022 -----------------------------------------  Lab work-up is reassuring.  There is no leukocytosis.  Electrolytes are normal.  ALT is borderline elevated which is not clinically significant.  On reassessment the patient states she is feeling significantly better and appears comfortable.  She is not having active abdominal pain at this time.  She is stable for discharge home.  I counseled her on the results of the work-up and plan of care.  I will prescribe Zofran and Bentyl for  symptomatic treatment.  Return precautions provided, and the patient expresses understanding.   FINAL CLINICAL IMPRESSION(S) / ED DIAGNOSES   Final diagnoses:  Diarrhea of presumed infectious origin  COVID-19     Rx / DC Orders   ED Discharge Orders          Ordered    dicyclomine (BENTYL) 10 MG capsule  Every 8 hours PRN        07/18/22 1121    ondansetron (ZOFRAN-ODT) 4 MG disintegrating tablet  Every 8 hours PRN        07/18/22 1121             Note:  This document was prepared using Dragon voice recognition software and may include unintentional dictation errors.    Arta Silence, MD 07/18/22 1124

## 2022-07-18 NOTE — ED Notes (Signed)
Pt resting comfortably, RR even and unlabored. NAD noted

## 2022-07-31 ENCOUNTER — Telehealth: Payer: Self-pay

## 2022-07-31 DIAGNOSIS — N951 Menopausal and female climacteric states: Secondary | ICD-10-CM

## 2022-07-31 NOTE — Telephone Encounter (Signed)
This pt needs a refill on her hormone medication she is on the waiting list for an appt

## 2022-08-04 ENCOUNTER — Other Ambulatory Visit: Payer: Self-pay

## 2022-08-04 NOTE — Telephone Encounter (Addendum)
Called patient to clarify which medication she is needed refilled. LVM and will wait for patient to call back.

## 2022-08-11 MED ORDER — ESTRADIOL 1 MG PO TABS: 1.0000 mg | ORAL_TABLET | Freq: Every day | ORAL | 0 refills | Status: DC

## 2022-08-11 NOTE — Addendum Note (Signed)
Addended by: Marykay Lex on: 08/11/2022 08:47 AM   Modules accepted: Orders

## 2022-08-11 NOTE — Telephone Encounter (Signed)
Patient returned call and specified she needs estradiol refilled. Made aware one refill will be sent in as patient is waiting to schedule her annual. Patient is understanding.

## 2022-11-06 ENCOUNTER — Other Ambulatory Visit: Payer: Self-pay

## 2022-11-06 DIAGNOSIS — N951 Menopausal and female climacteric states: Secondary | ICD-10-CM

## 2022-12-10 ENCOUNTER — Emergency Department: Payer: Medicaid Other

## 2022-12-10 ENCOUNTER — Other Ambulatory Visit: Payer: Self-pay

## 2022-12-10 ENCOUNTER — Emergency Department
Admission: EM | Admit: 2022-12-10 | Discharge: 2022-12-10 | Disposition: A | Discharge status: Home / Self Care | Payer: Medicaid Other | Attending: Emergency Medicine | Admitting: Emergency Medicine

## 2022-12-10 DIAGNOSIS — R0789 Other chest pain: Secondary | ICD-10-CM | POA: Insufficient documentation

## 2022-12-10 DIAGNOSIS — R079 Chest pain, unspecified: Secondary | ICD-10-CM

## 2022-12-10 DIAGNOSIS — Z1152 Encounter for screening for COVID-19: Secondary | ICD-10-CM | POA: Insufficient documentation

## 2022-12-10 LAB — BASIC METABOLIC PANEL
Anion gap: 8 (ref 5–15)
BUN: 12 mg/dL (ref 6–20)
CO2: 24 mmol/L (ref 22–32)
Calcium: 9 mg/dL (ref 8.9–10.3)
Chloride: 106 mmol/L (ref 98–111)
Creatinine, Ser: 0.84 mg/dL (ref 0.44–1.00)
GFR, Estimated: >60 mL/min (ref >60)
Glucose, Bld: 106 mg/dL — ABNORMAL HIGH (ref 70–99)
Potassium: 3.6 mmol/L (ref 3.5–5.1)
Sodium: 138 mmol/L (ref 135–145)

## 2022-12-10 LAB — CBC
HCT: 35 % — ABNORMAL LOW (ref 36.0–46.0)
Hemoglobin: 10.7 g/dL — ABNORMAL LOW (ref 12.0–15.0)
MCH: 25.4 pg — ABNORMAL LOW (ref 26.0–34.0)
MCHC: 30.6 g/dL (ref 30.0–36.0)
MCV: 82.9 fL (ref 80.0–100.0)
Platelets: 377 10*3/uL (ref 150–400)
RBC: 4.22 MIL/uL (ref 3.87–5.11)
RDW: 14 % (ref 11.5–15.5)
WBC: 13 10*3/uL — ABNORMAL HIGH (ref 4.0–10.5)
nRBC: 0 % (ref 0.0–0.2)

## 2022-12-10 LAB — TROPONIN I (HIGH SENSITIVITY)
Troponin I (High Sensitivity): 3 ng/L (ref <18)
Troponin I (High Sensitivity): <2 ng/L (ref <18)

## 2022-12-10 LAB — RESP PANEL BY RT-PCR (RSV, FLU A&B, COVID)  RVPGX2
Influenza A by PCR: NEGATIVE
Influenza B by PCR: NEGATIVE
Resp Syncytial Virus by PCR: NEGATIVE
SARS Coronavirus 2 by RT PCR: NEGATIVE

## 2022-12-10 MED ORDER — FLUCONAZOLE 150 MG PO TABS: 150.0000 mg | ORAL_TABLET | Freq: Every day | ORAL | 1 refills | Status: DC

## 2022-12-10 MED ORDER — ALBUTEROL SULFATE HFA 108 (90 BASE) MCG/ACT IN AERS: 2.0000 | INHALATION_SPRAY | Freq: Four times a day (QID) | RESPIRATORY_TRACT | 2 refills | Status: DC | PRN

## 2022-12-10 MED ORDER — AZITHROMYCIN 250 MG PO TABS: ORAL_TABLET | ORAL | 0 refills | Status: AC

## 2022-12-10 NOTE — Discharge Instructions (Signed)
Please seek medical attention for any high fevers, chest pain, shortness of breath, change in behavior, persistent vomiting, bloody stool or any other new or concerning symptoms.  

## 2022-12-10 NOTE — ED Triage Notes (Signed)
Pt here cp that started this morning. Pt states pain is centered and does not radiates. Pt states the pain is throbbing in nature. Pt states she has been sick for while.

## 2022-12-10 NOTE — ED Provider Notes (Signed)
[facility] Provider Note    Event Date/Time   First MD Initiated Contact with Patient 12/10/22 1105     (approximate)   History   Chest Pain   HPI  Martha Freeman is a 47 y.o. female who presented to the emergency department today because of concerns for continued illness, chest pain.  Patient says that she was diagnosed with COVID roughly 1 month ago.  Since that time she has had lingering symptoms including cough, fatigue.  She states she has not felt well since getting COVID.  Today she tried to go to work when she started feeling some chest pain. Cough is productive of greenish phlegm.      Physical Exam   Triage Vital Signs: ED Triage Vitals  Enc Vitals Group     BP 12/10/22 0839 (!) 137/92     Pulse Rate 12/10/22 0839 96     Resp 12/10/22 0839 16     Temp 12/10/22 0839 98.8 F (37.1 C)     Temp src --      SpO2 12/10/22 0839 100 %     Weight 12/10/22 0841 214 lb 15.2 oz (97.5 kg)     Height 12/10/22 0841 '5\' 6"'$  (1.676 m)     Head Circumference --      Peak Flow --      Pain Score 12/10/22 0841 6     Pain Loc --      Pain Edu? --      Excl. in [place]? --     Most recent vital signs: Vitals:   12/10/22 0839  BP: (!) 137/92  Pulse: 96  Resp: 16  Temp: 98.8 F (37.1 C)  SpO2: 100%   General: Awake, alert, oriented. CV:  Good peripheral perfusion. Regular rate and rhythm Resp:  Normal effort. Lungs clear. Abd:  No distention.   ED Results / Procedures / Treatments   Labs (all labs ordered are listed, but only abnormal results are displayed) Labs Reviewed  BASIC METABOLIC PANEL - Abnormal; Notable for the following components:      Result Value   Glucose, Bld 106 (*)    All other components within normal limits  CBC - Abnormal; Notable for the following components:   WBC 13.0 (*)    Hemoglobin 10.7 (*)    HCT 35.0 (*)    MCH 25.4 (*)    All other components within normal limits  RESP PANEL BY RT-PCR (RSV, FLU A&B, COVID)   RVPGX2  POC URINE PREG, ED  TROPONIN I (HIGH SENSITIVITY)  TROPONIN I (HIGH SENSITIVITY)     EKG  I, Nance Pear, attending physician, personally viewed and interpreted this EKG  EKG Time: 0843 Rate: 90 Rhythm: normal sinus rhythm  Axis: normal Intervals: qtc 420 QRS: narrow ST changes: no st elevation Impression: normal ekg   RADIOLOGY I independently interpreted and visualized the CXR. My interpretation: No pneumonia Radiology interpretation:  IMPRESSION:  Indeterminate left perihilar opacity, possibly due to overlapping  osseous and vascular shadows, although pulmonary nodule can not be  excluded. Recommend further evaluation with chest CT. Lungs are  otherwise clear.     PROCEDURES:  Critical Care performed: No  Procedures   MEDICATIONS ORDERED IN ED: Medications - No data to display   IMPRESSION / MDM / [place] / ED COURSE  I reviewed the triage vital signs and the nursing notes.  Differential diagnosis includes, but is not limited to, viral URI, pneumonia, ACS  Patient's presentation is most consistent with acute presentation with potential threat to life or bodily function.  Patient presented to the emergency department today because of concerns for some chest pain.  Patient has been feeling sick for almost 1 month.  Workup today without concerning findings for pneumonia on chest x-ray.  Did show possible pulmonary nodule.  Additionally viral test was negative here in emergency department.  Troponin negative x 2.  At this point do wonder if patient has bronchitis related to COVID diagnosis last month.  Will plan on giving patient prescription for albuterol inhaler.  Additionally will trial short course of antibiotics.  Furthermore discussed possibility of pulmonary nodule on chest x-ray with patient and recommended primary care follow-up.   FINAL CLINICAL IMPRESSION(S) / ED DIAGNOSES   Final diagnoses:   Nonspecific chest pain    Note:  This document was prepared using Dragon voice recognition software and may include unintentional dictation errors.    Nance Pear, MD 12/10/22 1520

## 2022-12-25 ENCOUNTER — Emergency Department
Admission: EM | Admit: 2022-12-25 | Discharge: 2022-12-25 | Disposition: A | Discharge status: Home / Self Care | Payer: Medicaid Other | Attending: Emergency Medicine | Admitting: Emergency Medicine

## 2022-12-25 ENCOUNTER — Encounter: Payer: Self-pay | Admitting: Emergency Medicine

## 2022-12-25 ENCOUNTER — Other Ambulatory Visit: Payer: Self-pay

## 2022-12-25 DIAGNOSIS — K644 Residual hemorrhoidal skin tags: Secondary | ICD-10-CM | POA: Insufficient documentation

## 2022-12-25 DIAGNOSIS — K649 Unspecified hemorrhoids: Secondary | ICD-10-CM

## 2022-12-25 MED ORDER — MORPHINE SULFATE (PF) 4 MG/ML IV SOLN
4.0000 mg | Freq: Once | INTRAVENOUS | Status: AC
  Administered 2022-12-25: 4 mg via INTRAMUSCULAR
  Filled 2022-12-25: qty 1

## 2022-12-25 MED ORDER — OXYCODONE-ACETAMINOPHEN 5-325 MG PO TABS
2.0000 | ORAL_TABLET | Freq: Once | ORAL | Status: AC
  Administered 2022-12-25: 2 via ORAL
  Filled 2022-12-25: qty 2

## 2022-12-25 MED ORDER — ONDANSETRON 4 MG PO TBDP
4.0000 mg | ORAL_TABLET | Freq: Once | ORAL | Status: AC
  Administered 2022-12-25: 4 mg via ORAL
  Filled 2022-12-25: qty 1

## 2022-12-25 MED ORDER — HYDROCORT-PRAMOXINE (PERIANAL) 1-1 % EX FOAM: 1.0000 | Freq: Three times a day (TID) | CUTANEOUS | 2 refills | Status: AC | PRN

## 2022-12-25 MED ORDER — LIDOCAINE-EPINEPHRINE-TETRACAINE (LET) TOPICAL GEL
3.0000 mL | Freq: Once | TOPICAL | Status: AC
  Administered 2022-12-25: 3 mL via TOPICAL
  Filled 2022-12-25: qty 3

## 2022-12-25 MED ORDER — HYDROCORT-PRAMOXINE (PERIANAL) 1-1 % EX FOAM
1.0000 | Freq: Two times a day (BID) | CUTANEOUS | Status: DC
  Administered 2022-12-25: 1 via RECTAL
  Filled 2022-12-25: qty 10

## 2022-12-25 MED ORDER — LIDOCAINE HCL URETHRAL/MUCOSAL 2 % EX GEL
1.0000 | Freq: Once | CUTANEOUS | Status: AC
  Administered 2022-12-25: 1 via TOPICAL
  Filled 2022-12-25: qty 10

## 2022-12-25 NOTE — ED Notes (Signed)
Pt sts she is in too much pain and does not feel comfortable being discharge. MD Beather Arbour notified.

## 2022-12-25 NOTE — ED Notes (Signed)
ED Provider at bedside. 

## 2022-12-25 NOTE — Discharge Instructions (Signed)
You may use Proctofoam 3 times daily as needed for hemorrhoidal pain.  Take a daily stool softener.  Return to the ER for worsening symptoms, persistent vomiting, heavy bleeding or other concerns.

## 2022-12-25 NOTE — ED Triage Notes (Signed)
Patient ambulatory to triage with slow steady gait, pt tearful; c/o hemorrhoid pain since yesterday with hx of same

## 2022-12-25 NOTE — ED Provider Notes (Signed)
[facility] Provider Note    Event Date/Time   First MD Initiated Contact with Patient 12/25/22 0159     (approximate)   History   Hemorrhoids   HPI  Martha Freeman is a 47 y.o. female who presents to the ED from home with a chief complaint of hemorrhoidal pain.  Patient has suffered from hemorrhoids for the past 2 years.  Was referred to a general surgeon who told patient she was inoperable due to concern for acute blood loss.  Her GYN was prescribing her steroid cream but her GYN has since left so patient no longer has creams.  Ate something with onion powder the other day and thinks it exacerbated her hemorrhoid.  Taking Tylenol and sitz bath's without relief of symptoms.  Denies fever/chills, abdominal pain, nausea or vomiting.     Past Medical History   Past Medical History:  Diagnosis Date   Anxiety    Depression    post partum   Family history of adverse reaction to anesthesia    seizure with morphine   Fatigue    Heavy periods    Hemorrhoids    History of kidney stones    IBS (irritable bowel syndrome)    Ovarian cyst    both sides     Active Problem List   Patient Active Problem List   Diagnosis Date Noted   Complex ovarian cyst 05/07/2020   Ovarian cyst 03/07/2020     Past Surgical History   Past Surgical History:  Procedure Laterality Date   ABDOMINAL HYSTERECTOMY     LAPAROSCOPIC VAGINAL HYSTERECTOMY WITH SALPINGO OOPHORECTOMY Bilateral 05/07/2020   Procedure: LAPAROSCOPIC ASSISTED VAGINAL HYSTERECTOMY WITH SALPINGO OOPHORECTOMY;  Surgeon: Harlin Heys, MD;  Location: [facility] ORS;  Service: Gynecology;  Laterality: Bilateral;   RHINOPLASTY     TONSILLECTOMY     TUBAL LIGATION       Home Medications   Prior to Admission medications   Medication Sig Start Date End Date Taking? Authorizing Provider  hydrocortisone-pramoxine [facility]) rectal foam Place 1 applicator rectally every 8 (eight) hours as needed for  hemorrhoids or anal itching. 12/25/22  Yes Paulette Blanch, MD  albuterol (VENTOLIN HFA) 108 (90 Base) MCG/ACT inhaler Inhale 2 puffs into the lungs every 6 (six) hours as needed for wheezing or shortness of breath. 12/10/22   Nance Pear, MD  APPLE CIDER VINEGAR PO Take 2 tablets by mouth 3 (three) times daily after meals. GUMMIES    [provider]  BLACK ELDERBERRY PO Take 50 mg by mouth at bedtime.    [provider]  cyanocobalamin (,VITAMIN B-12,) 1000 MCG/ML injection 1ML q monthly Patient taking differently: Inject 1,000 mcg into the muscle every 30 (thirty) days.  03/30/19   Shambley, Melody N, CNM  dicyclomine (BENTYL) 10 MG capsule Take 1 capsule (10 mg total) by mouth every 8 (eight) hours as needed (diarrhea, cramping). 07/18/22   Arta Silence, MD  estradiol (ESTRACE) 1 MG tablet Take 1 tablet (1 mg total) by mouth daily. 08/11/22   Harlin Heys, MD  ESTROVEN MENOPAUSE & WEIGHT [phone] MG CAPS Take 1 tablet by mouth at bedtime.    [provider]  fluconazole (DIFLUCAN) 150 MG tablet Take 1 tablet (150 mg total) by mouth daily. 12/10/22   Nance Pear, MD  hydrocortisone (ANUSOL-HC) 2.5 % rectal cream PLACE 1 APPLICATION RECTALLY 2 (TWO) TIMES DAILY. Patient taking differently: Place 1 application rectally 2 (two) times daily as needed for  hemorrhoids or anal itching.  09/09/19   Shambley, Melody N, CNM  lactulose (CHRONULAC) 10 GM/15ML solution Take 30 mLs (20 g total) by mouth daily as needed for mild constipation. 03/03/20   Paulette Blanch, MD  Levonorgestrel-Ethinyl Estradiol (AMETHIA) 0.15-0.03 &0.01 MG tablet Take 1 tablet by mouth at bedtime. 10/03/20   Harlin Heys, MD  melatonin 5 MG TABS Take 5 mg by mouth at bedtime.    [provider]  ondansetron (ZOFRAN-ODT) 4 MG disintegrating tablet Take 1 tablet (4 mg total) by mouth every 8 (eight) hours as needed for nausea or vomiting. 07/18/22   Arta Silence, MD  Probiotic  Product (PROBIOTIC PO) Take 2 capsules by mouth every evening. WITH ASHWAGANDHA    [provider]     Allergies  Aspirin   Family History   Family History  Problem Relation Age of Onset   Cancer Mother        ENDOMETRIAL CANCER   Cancer Paternal Aunt        BREAST   Heart disease Maternal Grandmother    Breast cancer Neg Hx    Ovarian cancer Neg Hx      Physical Exam  Triage Vital Signs: ED Triage Vitals  Enc Vitals Group     BP 12/25/22 0154 127/83     Pulse Rate 12/25/22 0154 94     Resp 12/25/22 0154 20     Temp 12/25/22 0154 98.3 F (36.8 C)     Temp Source 12/25/22 0154 Oral     SpO2 12/25/22 0154 100 %     Weight 12/25/22 0147 209 lb (94.8 kg)     Height 12/25/22 0147 '5\' 7"'$  (1.702 m)     Head Circumference --      Peak Flow --      Pain Score 12/25/22 0147 10     Pain Loc --      Pain Edu? --      Excl. in [place]? --     Updated Vital Signs: BP 127/83 (BP Location: Left Arm)   Pulse 94   Temp 98.3 F (36.8 C) (Oral)   Resp 20   Ht '5\' 7"'$  (1.702 m)   Wt 94.8 kg   LMP 05/02/2020   SpO2 100%   BMI 32.73 kg/m    General: Awake, moderate distress.  Tearful. CV:  Good peripheral perfusion.  Resp:  Normal effort.  Abd:  No distention.  Other:  Grade 2/3 external hemorrhoid without bleeding or thrombosis.  Patient declines manual reduction.   ED Results / Procedures / Treatments  Labs (all labs ordered are listed, but only abnormal results are displayed) Labs Reviewed - No data to display   EKG  None   RADIOLOGY None   Official radiology report(s): No results found.   PROCEDURES:  Critical Care performed: No  Procedures   MEDICATIONS ORDERED IN ED: Medications  hydrocortisone-pramoxine (PROCTOFOAM-HC) rectal foam 1 applicator (1 applicator Rectal Given 12/25/22 0233)  lidocaine (XYLOCAINE) 2 % jelly 1 Application (1 Application Topical Given 12/25/22 0233)  morphine (PF) 4 MG/ML injection 4 mg (4 mg Intramuscular Given  12/25/22 0231)  ondansetron (ZOFRAN-ODT) disintegrating tablet 4 mg (4 mg Oral Given 12/25/22 0231)  lidocaine-EPINEPHrine-tetracaine (LET) topical gel (3 mLs Topical Given 12/25/22 0358)  oxyCODONE-acetaminophen (PERCOCET/ROXICET) 5-325 MG per tablet 2 tablet (2 tablets Oral Given 12/25/22 0405)     IMPRESSION / MDM / ASSESSMENT AND PLAN / ED COURSE  I reviewed the triage vital signs and  the nursing notes.                             46 year old female presenting with pain secondary to hemorrhoid.  Will apply topical lidocaine jelly, Proctofoam.  Administer single dose IM Morphine for better pain control.  Patient's presentation is most consistent with acute, uncomplicated illness.  0358 Patient still tearful complaining of severe pain after application of lidocaine and Proctofoam with IM morphine.  Applied let to affected area and easily reduced the hemorrhoid manually.  Will administer Percocet for additional pain control.  0440 Pain improved after manual reduction of hemorrhoid with LET and Percocet.  Strict return precautions given.  Patient verbalizes understanding and agrees with plan of care.   FINAL CLINICAL IMPRESSION(S) / ED DIAGNOSES   Final diagnoses:  Hemorrhoids, unspecified hemorrhoid type     Rx / DC Orders   ED Discharge Orders          Ordered    hydrocortisone-pramoxine (PROCTOFOAM-HC) rectal foam  Every 8 hours PRN        12/25/22 0232             Note:  This document was prepared using Dragon voice recognition software and may include unintentional dictation errors.   Paulette Blanch, MD 12/25/22 [id number]

## 2023-04-15 ENCOUNTER — Ambulatory Visit: Payer: Medicaid Other | Admitting: Obstetrics and Gynecology

## 2023-04-29 ENCOUNTER — Ambulatory Visit: Payer: Medicaid Other | Admitting: Obstetrics and Gynecology

## 2023-05-06 ENCOUNTER — Ambulatory Visit: Payer: Medicaid Other | Admitting: Obstetrics and Gynecology

## 2023-06-02 ENCOUNTER — Encounter: Payer: Self-pay | Admitting: Obstetrics and Gynecology

## 2023-06-02 ENCOUNTER — Ambulatory Visit (INDEPENDENT_AMBULATORY_CARE_PROVIDER_SITE_OTHER): Payer: Medicaid Other | Admitting: Obstetrics and Gynecology

## 2023-06-02 VITALS — BP 124/83 | HR 80 | Ht 67.0 in | Wt 216.6 lb

## 2023-06-02 DIAGNOSIS — Z1231 Encounter for screening mammogram for malignant neoplasm of breast: Secondary | ICD-10-CM

## 2023-06-02 DIAGNOSIS — Z01419 Encounter for gynecological examination (general) (routine) without abnormal findings: Secondary | ICD-10-CM | POA: Diagnosis not present

## 2023-06-02 DIAGNOSIS — N951 Menopausal and female climacteric states: Secondary | ICD-10-CM

## 2023-06-02 MED ORDER — ESTRADIOL 2 MG PO TABS: 2.0000 mg | ORAL_TABLET | Freq: Every day | ORAL | 3 refills | Status: DC

## 2023-06-02 NOTE — Progress Notes (Signed)
Patients presents for annual exam today. She states menopausal symptoms due to not taking her HRT for the last 3 months, would like to restart. History of LAVH/BSO in 2021. Patient is due for mammogram, ordered. Annual labs are ordered.

## 2023-06-02 NOTE — Progress Notes (Signed)
HPI:      [name] is a 47 y.o. [id number] who LMP was [name] was 05/02/2020.  Subjective:   She presents today for her annual examination.  She has not seen [place] in about 3 years.  She has been taking ERT after previous hysterectomy and oophorectomy but ran out of her prescription 3 months ago.  She is now having hot flashes weight gain and other signs of menopause.  She would like to restart her estrogen.    Hx: The following portions of the patient's history were reviewed and updated as appropriate:             She  has a past medical history of Anxiety, Depression, Family history of adverse reaction to anesthesia, Fatigue, Heavy periods, Hemorrhoids, History of kidney stones, IBS (irritable bowel syndrome), and Ovarian cyst. She does not have any pertinent problems on file. She  has a past surgical history that includes Rhinoplasty; Tubal ligation; Tonsillectomy; Laparoscopic vaginal hysterectomy with salpingo oophorectomy (Bilateral, 05/07/2020); and Abdominal hysterectomy. Her family history includes Cancer in her mother and paternal aunt; Heart disease in her maternal grandmother. She  reports that she quit smoking about 3 years ago. Her smoking use included cigarettes. She started smoking about 13 years ago. She has a 2.5 pack-year smoking history. She has never used smokeless tobacco. She reports current alcohol use. She reports that she does not use drugs. She has a current medication list which includes the following prescription(s): estradiol, albuterol, apple cider vinegar, black elderberry, cyanocobalamin, dicyclomine, estroven menopause & weight, fluconazole, hydrocortisone, hydrocortisone-pramoxine, lactulose, levonorgestrel-ethinyl estradiol, melatonin, ondansetron, and probiotic product. She is allergic to aspirin.       Review of Systems:  Review of Systems  Constitutional: Denied constitutional symptoms, night sweats, recent illness, fatigue,  fever, insomnia and weight loss.  Eyes: Denied eye symptoms, eye pain, photophobia, vision change and visual disturbance.  Ears/Nose/Throat/Neck: Denied ear, nose, throat or neck symptoms, hearing loss, nasal discharge, sinus congestion and sore throat.  Cardiovascular: Denied cardiovascular symptoms, arrhythmia, chest pain/pressure, edema, exercise intolerance, orthopnea and palpitations.  Respiratory: Denied pulmonary symptoms, asthma, pleuritic pain, productive sputum, cough, dyspnea and wheezing.  Gastrointestinal: Denied, gastro-esophageal reflux, melena, nausea and vomiting.  Genitourinary: Denied genitourinary symptoms including symptomatic vaginal discharge, pelvic relaxation issues, and urinary complaints.  Musculoskeletal: Denied musculoskeletal symptoms, stiffness, swelling, muscle weakness and myalgia.  Dermatologic: Denied dermatology symptoms, rash and scar.  Neurologic: Denied neurology symptoms, dizziness, headache, neck pain and syncope.  Psychiatric: Denied psychiatric symptoms, anxiety and depression.  Endocrine: See HPI for additional information.   Meds:   Current Outpatient Medications on File Prior to Visit  Medication Sig Dispense Refill   albuterol (VENTOLIN HFA) 108 (90 Base) MCG/ACT inhaler Inhale 2 puffs into the lungs every 6 (six) hours as needed for wheezing or shortness of breath. (Patient not taking: Reported on 06/02/2023) 8 g 2   APPLE CIDER VINEGAR PO Take 2 tablets by mouth 3 (three) times daily after meals. GUMMIES (Patient not taking: Reported on 06/02/2023)     BLACK ELDERBERRY PO Take 50 mg by mouth at bedtime. (Patient not taking: Reported on 06/02/2023)     cyanocobalamin (,VITAMIN B-12,) 1000 MCG/ML injection q monthly (Patient not taking: Reported on 06/02/2023) 1 mL 6   dicyclomine (BENTYL) 10 MG capsule Take 1 capsule (10 mg total) by mouth every 8 (eight) hours as needed (diarrhea, cramping). (Patient not taking: Reported on 06/02/2023) 12 capsule 0  ESTROVEN MENOPAUSE & WEIGHT 40-56-300 MG CAPS Take 1 tablet by mouth at bedtime. (Patient not taking: Reported on 06/02/2023)     fluconazole (DIFLUCAN) 150 MG tablet Take 1 tablet (150 mg total) by mouth daily. (Patient not taking: Reported on 06/02/2023) 1 tablet 1   hydrocortisone (ANUSOL-HC) 2.5 % rectal cream PLACE 1 APPLICATION RECTALLY 2 (TWO) TIMES DAILY. (Patient not taking: Reported on 06/02/2023) 28.35 g 1   hydrocortisone-pramoxine (PROCTOFOAM-HC) rectal foam Place 1 applicator rectally every 8 (eight) hours as needed for hemorrhoids or anal itching. (Patient not taking: Reported on 06/02/2023) 10 g 2   lactulose (CHRONULAC) 10 GM/15ML solution Take 30 mLs (20 g total) by mouth daily as needed for mild constipation. (Patient not taking: Reported on 06/02/2023) 120 mL 0   Levonorgestrel-Ethinyl Estradiol (AMETHIA) 0.15-0.03 &0.01 MG tablet Take 1 tablet by mouth at bedtime. (Patient not taking: Reported on 06/02/2023) 84 tablet 1   melatonin 5 MG TABS Take 5 mg by mouth at bedtime. (Patient not taking: Reported on 06/02/2023)     ondansetron (ZOFRAN-ODT) 4 MG disintegrating tablet Take 1 tablet (4 mg total) by mouth every 8 (eight) hours as needed for nausea or vomiting. (Patient not taking: Reported on 06/02/2023) 15 tablet 0   Probiotic Product (PROBIOTIC PO) Take 2 capsules by mouth every evening. WITH ASHWAGANDHA (Patient not taking: Reported on 06/02/2023)     No current facility-administered medications on file prior to visit.     Objective:     Vitals:   06/02/23 0929  BP: 124/83  Pulse: 80    Filed Weights   06/02/23 0929  Weight: 216 lb 9.6 oz (98.2 kg)              Physical examination General NAD, Conversant  HEENT Atraumatic; Op clear with mmm.  Normo-cephalic. Pupils reactive. Anicteric sclerae  Thyroid/Neck Smooth without nodularity or enlargement. Normal ROM.  Neck Supple.  Skin No rashes, lesions or ulceration. Normal palpated skin turgor. No nodularity.  Breasts:  No masses or discharge.  Symmetric.  No axillary adenopathy.  Lungs: Clear to auscultation.No rales or wheezes. Normal Respiratory effort, no retractions.  Heart: NSR.  No murmurs or rubs appreciated. No peripheral edema  Abdomen: Soft.  Non-tender.  No masses.  No HSM. No hernia  Extremities: Moves all appropriately.  Normal ROM for age. No lymphadenopathy.  Neuro: Oriented to PPT.  Normal mood. Normal affect.     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 Well woman exam with routine gynecological exam   2. Screening mammogram for breast cancer   3. Symptomatic menopausal or female climacteric states     She ran out of HRT 3 months ago and is having hot flashes weight gain other symptoms of menopause.  She would like to restart.   Plan:            1.  Basic Screening Recommendations The basic screening recommendations for asymptomatic women were discussed with the patient during her visit.  The age-appropriate recommendations were discussed with her and the rational for the tests reviewed.  When I am informed by the patient that another primary care physician has previously obtained the age-appropriate tests and they are up-to-date, only outstanding tests are ordered and referrals given as necessary.  Abnormal results of tests will be discussed with her when all  of her results are completed.  Routine preventative health maintenance measures emphasized: Exercise/Diet/Weight control, Tobacco Warnings, Alcohol/Substance use risks and Stress Management  2.  Mammogram ordered  3.  Restart estrogen.  Orders Orders Placed This Encounter  Procedures   MM DIGITAL SCREENING BILATERAL   Basic metabolic panel   CBC   TSH   Lipid panel   Hemoglobin A1c     Meds ordered this encounter  Medications   estradiol (ESTRACE) 2 MG tablet    Sig: Take 1 tablet (2 mg  total) by mouth daily.    Dispense:  90 tablet    Refill:  3            F/U  Return in about 1 year (around 06/01/2024) for Annual Physical.  Elonda Husky, M.D. 06/02/2023 9:55 AM

## 2023-06-03 LAB — HEMOGLOBIN A1C
Est. average glucose Bld gHb Est-mCnc: 105 mg/dL
Hgb A1c MFr Bld: 5.3 % (ref 4.8–5.6)

## 2023-06-03 LAB — LIPID PANEL
Chol/HDL Ratio: 3.4 ratio (ref 0.0–4.4)
Cholesterol, Total: 171 mg/dL (ref 100–199)
HDL: 50 mg/dL (ref >39)
LDL Chol Calc (NIH): 102 mg/dL — ABNORMAL HIGH (ref 0–99)
Triglycerides: 105 mg/dL (ref 0–149)
VLDL Cholesterol Cal: 19 mg/dL (ref 5–40)

## 2023-06-03 LAB — BASIC METABOLIC PANEL
BUN/Creatinine Ratio: 12 (ref 9–23)
BUN: 11 mg/dL (ref 6–24)
CO2: 21 mmol/L (ref 20–29)
Calcium: 9.8 mg/dL (ref 8.7–10.2)
Chloride: 102 mmol/L (ref 96–106)
Creatinine, Ser: 0.9 mg/dL (ref 0.57–1.00)
Glucose: 95 mg/dL (ref 70–99)
Potassium: 4.2 mmol/L (ref 3.5–5.2)
Sodium: 139 mmol/L (ref 134–144)
eGFR: 81 mL/min/{1.73_m2} (ref >59)

## 2023-06-03 LAB — CBC
Hematocrit: 34.8 % (ref 34.0–46.6)
Hemoglobin: 10.8 g/dL — ABNORMAL LOW (ref 11.1–15.9)
MCH: 24.9 pg — ABNORMAL LOW (ref 26.6–33.0)
MCHC: 31 g/dL — ABNORMAL LOW (ref 31.5–35.7)
MCV: 80 fL (ref 79–97)
Platelets: 426 10*3/uL (ref 150–450)
RBC: 4.33 x10E6/uL (ref 3.77–5.28)
RDW: 13.1 % (ref 11.7–15.4)
WBC: 7.5 10*3/uL (ref 3.4–10.8)

## 2023-06-03 LAB — TSH: TSH: 2.06 u[IU]/mL (ref 0.450–4.500)

## 2023-06-08 ENCOUNTER — Other Ambulatory Visit: Payer: Self-pay

## 2023-06-08 NOTE — Telephone Encounter (Signed)
Patient wants B-12, discuss with DJE regarding RX

## 2024-02-23 ENCOUNTER — Emergency Department: Payer: Self-pay

## 2024-02-23 ENCOUNTER — Emergency Department
Admission: EM | Admit: 2024-02-23 | Discharge: 2024-02-23 | Disposition: A | Discharge status: Home / Self Care | Payer: Self-pay | Attending: Emergency Medicine | Admitting: Emergency Medicine

## 2024-02-23 DIAGNOSIS — J189 Pneumonia, unspecified organism: Secondary | ICD-10-CM | POA: Insufficient documentation

## 2024-02-23 DIAGNOSIS — J45909 Unspecified asthma, uncomplicated: Secondary | ICD-10-CM | POA: Insufficient documentation

## 2024-02-23 LAB — CBC WITH DIFFERENTIAL/PLATELET
Abs Immature Granulocytes: 0.06 10*3/uL (ref 0.00–0.07)
Basophils Absolute: 0 10*3/uL (ref 0.0–0.1)
Basophils Relative: 0 %
Eosinophils Absolute: 0 10*3/uL (ref 0.0–0.5)
Eosinophils Relative: 0 %
HCT: 32.9 % — ABNORMAL LOW (ref 36.0–46.0)
Hemoglobin: 10.1 g/dL — ABNORMAL LOW (ref 12.0–15.0)
Immature Granulocytes: 0 %
Lymphocytes Relative: 6 %
Lymphs Abs: 1 10*3/uL (ref 0.7–4.0)
MCH: 22 pg — ABNORMAL LOW (ref 26.0–34.0)
MCHC: 30.7 g/dL (ref 30.0–36.0)
MCV: 71.5 fL — ABNORMAL LOW (ref 80.0–100.0)
Monocytes Absolute: 0.6 10*3/uL (ref 0.1–1.0)
Monocytes Relative: 4 %
Neutro Abs: 16.1 10*3/uL — ABNORMAL HIGH (ref 1.7–7.7)
Neutrophils Relative %: 90 %
Platelets: 473 10*3/uL — ABNORMAL HIGH (ref 150–400)
RBC: 4.6 MIL/uL (ref 3.87–5.11)
RDW: 16.1 % — ABNORMAL HIGH (ref 11.5–15.5)
WBC: 17.9 10*3/uL — ABNORMAL HIGH (ref 4.0–10.5)
nRBC: 0 % (ref 0.0–0.2)

## 2024-02-23 LAB — URINALYSIS, W/ REFLEX TO CULTURE (INFECTION SUSPECTED)
Bilirubin Urine: NEGATIVE
Glucose, UA: NEGATIVE mg/dL
Hgb urine dipstick: NEGATIVE
Ketones, ur: NEGATIVE mg/dL
Leukocytes,Ua: NEGATIVE
Nitrite: NEGATIVE
Protein, ur: 100 mg/dL — AB
Specific Gravity, Urine: 1.045 — ABNORMAL HIGH (ref 1.005–1.030)
pH: 5 (ref 5.0–8.0)

## 2024-02-23 LAB — COMPREHENSIVE METABOLIC PANEL WITH GFR
ALT: 32 U/L (ref 0–44)
AST: 24 U/L (ref 15–41)
Albumin: 4.2 g/dL (ref 3.5–5.0)
Alkaline Phosphatase: 77 U/L (ref 38–126)
Anion gap: 11 (ref 5–15)
BUN: 10 mg/dL (ref 6–20)
CO2: 20 mmol/L — ABNORMAL LOW (ref 22–32)
Calcium: 9.1 mg/dL (ref 8.9–10.3)
Chloride: 105 mmol/L (ref 98–111)
Creatinine, Ser: 0.79 mg/dL (ref 0.44–1.00)
GFR, Estimated: >60 mL/min (ref >60)
Glucose, Bld: 119 mg/dL — ABNORMAL HIGH (ref 70–99)
Potassium: 3.9 mmol/L (ref 3.5–5.1)
Sodium: 136 mmol/L (ref 135–145)
Total Bilirubin: 0.6 mg/dL (ref 0.0–1.2)
Total Protein: 7.9 g/dL (ref 6.5–8.1)

## 2024-02-23 LAB — RESP PANEL BY RT-PCR (RSV, FLU A&B, COVID)  RVPGX2
Influenza A by PCR: NEGATIVE
Influenza B by PCR: NEGATIVE
Resp Syncytial Virus by PCR: NEGATIVE
SARS Coronavirus 2 by RT PCR: NEGATIVE

## 2024-02-23 LAB — TROPONIN I (HIGH SENSITIVITY)
Troponin I (High Sensitivity): 4 ng/L (ref <18)
Troponin I (High Sensitivity): 4 ng/L (ref <18)

## 2024-02-23 LAB — GROUP A STREP BY PCR: Group A Strep by PCR: NOT DETECTED

## 2024-02-23 LAB — LACTIC ACID, PLASMA: Lactic Acid, Venous: 1.8 mmol/L (ref 0.5–1.9)

## 2024-02-23 MED ORDER — AZITHROMYCIN 250 MG PO TABS: ORAL_TABLET | ORAL | 0 refills | Status: AC

## 2024-02-23 MED ORDER — AZITHROMYCIN 500 MG PO TABS
500.0000 mg | ORAL_TABLET | ORAL | Status: AC
  Administered 2024-02-23: 500 mg via ORAL
  Filled 2024-02-23: qty 1

## 2024-02-23 MED ORDER — ACETAMINOPHEN 325 MG PO TABS
650.0000 mg | ORAL_TABLET | Freq: Once | ORAL | Status: AC
  Administered 2024-02-23: 650 mg via ORAL
  Filled 2024-02-23: qty 2

## 2024-02-23 MED ORDER — FLUCONAZOLE 100 MG PO TABS: 200.0000 mg | ORAL_TABLET | ORAL | 0 refills | Status: AC

## 2024-02-23 NOTE — ED Triage Notes (Signed)
 Pt presents to the ED POV from home with chest pressure. Pt reports being sick x2 weeks. Pt reports coughing up green stuff. Reports sore throat. Temp 102.6 at time of triage. Tylenol given

## 2024-02-23 NOTE — ED Provider Notes (Signed)
 [facility] Provider Note    Event Date/Time   First MD Initiated Contact with Patient 02/23/24 2120     (approximate)   History   Chief Complaint: Chest Pain   HPI  Martha Freeman is a 48 y.o. female with past history of of anxiety, IBS, asthma who comes ED complaining of chest tightness, productive cough, fever.  Symptoms started about 2 weeks ago, seem to improve for about a week but over the last couple of days since returning back home from vacation, patient's symptoms have been worsening.  She does note that they are out of town destination did not have a pollen burden like [place] has recently experienced.          Physical Exam   Triage Vital Signs: ED Triage Vitals  Encounter Vitals Group     BP 02/23/24 1823 (!) 151/77     Systolic BP Percentile --      Diastolic BP Percentile --      Pulse Rate 02/23/24 1823 (!) 103     Resp 02/23/24 1823 18     Temp 02/23/24 1823 (!) 102.6 F (39.2 C)     Temp Source 02/23/24 1823 Oral     SpO2 02/23/24 1823 97 %     Weight 02/23/24 1822 215 lb (97.5 kg)     Height 02/23/24 1822 5\' 6"  (1.676 m)     Head Circumference --      Peak Flow --      Pain Score 02/23/24 1822 9     Pain Loc --      Pain Education --      Exclude from Growth Chart --     Most recent vital signs: Vitals:   02/23/24 1823 02/23/24 1933  BP: (!) 151/77 136/80  Pulse: (!) 103 (!) 116  Resp: 18 18  Temp: (!) 102.6 F (39.2 C) 98.9 F (37.2 C)  SpO2: 97% 98%    General: Awake, no distress.  CV:  Good peripheral perfusion.  Regular rate rhythm, heart rate 80 Resp:  Normal effort.  Clear to auscultation bilaterally, no crackles or wheezing Abd:  No distention.  Soft nontender Other:  Moist oral mucosa.  TMs and external canals normal bilaterally.  There is nasal congestion   ED Results / Procedures / Treatments   Labs (all labs ordered are listed, but only abnormal results are displayed) Labs Reviewed   COMPREHENSIVE METABOLIC PANEL WITH GFR - Abnormal; Notable for the following components:      Result Value   CO2 20 (*)    Glucose, Bld 119 (*)    All other components within normal limits  CBC WITH DIFFERENTIAL/PLATELET - Abnormal; Notable for the following components:   WBC 17.9 (*)    Hemoglobin 10.1 (*)    HCT 32.9 (*)    MCV 71.5 (*)    MCH 22.0 (*)    RDW 16.1 (*)    Platelets 473 (*)    Neutro Abs 16.1 (*)    All other components within normal limits  URINALYSIS, W/ REFLEX TO CULTURE (INFECTION SUSPECTED) - Abnormal; Notable for the following components:   Color, Urine AMBER (*)    APPearance CLOUDY (*)    Specific Gravity, Urine 1.045 (*)    Protein, ur 100 (*)    Bacteria, UA MANY (*)    All other components within normal limits  GROUP A STREP BY PCR  RESP PANEL BY RT-PCR (RSV, FLU A&B, COVID)  [id number]  LACTIC ACID, PLASMA  TROPONIN I (HIGH SENSITIVITY)  TROPONIN I (HIGH SENSITIVITY)     EKG Interpreted by me Sinus tachycardia rate 118.  Normal axis and intervals.  Poor R wave progression.  Normal ST segments and T waves   RADIOLOGY Chest x-ray interpreted by me, appears normal.  Radiology report reviewed   PROCEDURES:  Procedures   MEDICATIONS ORDERED IN ED: Medications  azithromycin (ZITHROMAX) tablet 500 mg (has no administration in time range)  acetaminophen (TYLENOL) tablet 650 mg (650 mg Oral Given 02/23/24 1830)     IMPRESSION / MDM / ASSESSMENT AND PLAN / ED COURSE  I reviewed the triage vital signs and the nursing notes.  DDx: UTI, pneumonia, viral illness, atypical pneumonia, allergic rhinitis  Patient's presentation is most consistent with acute presentation with potential threat to life or bodily function.  Patient presents with chest tightness, productive cough, fever.  She is nontoxic, tolerating oral intake.  Not septic.  Due to the duration of her symptoms, warrants treatment with antibiotics for atypical pneumonia.  Exam and UA not  consistent with UTI.  Abdomen benign.  No evidence of otitis media.  COVID flu RSV and group A strep swab negative.  Will also prescribe fluconazole for Candida prophylaxis at pt's request.       FINAL CLINICAL IMPRESSION(S) / ED DIAGNOSES   Final diagnoses:  Atypical pneumonia     Rx / DC Orders   ED Discharge Orders          Ordered    azithromycin (ZITHROMAX Z-PAK) 250 MG tablet        02/23/24 2135    fluconazole (DIFLUCAN) 100 MG tablet  Weekly        02/23/24 2143             Note:  This document was prepared using Dragon voice recognition software and may include unintentional dictation errors.   Sharman Cheek, MD 02/23/24 [phone]

## 2024-02-23 NOTE — ED Notes (Signed)
1 set cultures sent to lab 

## 2024-06-16 ENCOUNTER — Other Ambulatory Visit: Payer: Self-pay | Admitting: Obstetrics and Gynecology

## 2024-06-16 DIAGNOSIS — N951 Menopausal and female climacteric states: Secondary | ICD-10-CM

## 2024-06-16 NOTE — Telephone Encounter (Signed)
 Appointment has been scheduled for 09/16. Medication was sent to pharmacy for patient until appointment date.

## 2024-08-01 NOTE — Progress Notes (Unsigned)
 GYNECOLOGY ANNUAL PHYSICAL EXAM PROGRESS NOTE  Subjective:    Martha Freeman is a 48 y.o. G26P2012 female who presents for an annual exam.  The patient {is/is not/has never been:13135} sexually active. The patient participates in regular exercise: {yes/no/not asked:9010}. Has the patient ever been transfused or tattooed?: {yes/no/not asked:9010}. The patient reports that there {is/is not:9024} domestic violence in her life.   The patient has the following complaints today:   Menstrual History: Menarche age: *** [name] was 05/02/2020.     Gynecologic History:  Contraception: status post hysterectomy History of STI's:  Last Pap: 05/28/2017. Results were: {norm/abn:16337}.  ***Denies/Notes h/o abnormal pap smears. Last mammogram: ***. Results were: {norm/abn:16337}       OB History  Gravida Para Term Preterm AB Living  3 2 2  0 1 2  SAB IAB Ectopic Multiple Live Births  1 0 0 0 2    # Outcome Date GA Lbr Len/2nd Weight Sex Type Anes PTL Lv  3 Term 04/19/04   8 lb (3.629 kg) F CS-LTranv  N LIV     Name: Beverley  2 SAB 2004          1 Term 11/16/01   5 lb 15 oz (2.693 kg) M CS-LTranv EPI N LIV     Name: Music therapist    Past Medical History:  Diagnosis Date   Anxiety    Depression    post partum   Family history of adverse reaction to anesthesia    seizure with morphine    Fatigue    Heavy periods    Hemorrhoids    History of kidney stones    IBS (irritable bowel syndrome)    Ovarian cyst    both sides    Past Surgical History:  Procedure Laterality Date   ABDOMINAL HYSTERECTOMY     LAPAROSCOPIC VAGINAL HYSTERECTOMY WITH SALPINGO OOPHORECTOMY Bilateral 05/07/2020   Procedure: LAPAROSCOPIC ASSISTED VAGINAL HYSTERECTOMY WITH SALPINGO OOPHORECTOMY;  Surgeon: Janit Alm Agent, MD;  Location: [facility] ORS;  Service: Gynecology;  Laterality: Bilateral;   RHINOPLASTY     TONSILLECTOMY     TUBAL LIGATION      Family History  Problem Relation Age  of Onset   Cancer Mother        ENDOMETRIAL CANCER   Cancer Paternal Aunt        BREAST   Heart disease Maternal Grandmother    Breast cancer Neg Hx    Ovarian cancer Neg Hx     Social History   Socioeconomic History   Marital status: Married    Spouse name: Not on file   Number of children: Not on file   Years of education: Not on file   Highest education level: Not on file  Occupational History   Not on file  Tobacco Use   Smoking status: Former    Current packs/day: 0.00    Average packs/day: 0.3 packs/day for 10.0 years (2.5 ttl pk-yrs)    Types: Cigarettes    Start date: 03/03/2010    Quit date: 03/03/2020    Years since quitting: 4.4   Smokeless tobacco: Never  Vaping Use   Vaping status: Every Day   Substances: Nicotine, CBD, Flavoring  Substance and Sexual Activity   Alcohol use: Yes    Alcohol/week: 0.0 standard drinks of alcohol    Comment: occasional    Drug use: No   Sexual activity: Yes    Partners: Female    Birth control/protection: Surgical  Comment: same sex  Other Topics Concern   Not on file  Social History Narrative   Not on file   Social Drivers of Health   Financial Resource Strain: Not on file  Food Insecurity: Not on file  Transportation Needs: Not on file  Physical Activity: Not on file  Stress: Not on file  Social Connections: Not on file  Intimate Partner Violence: Not on file    Current Outpatient Medications on File Prior to Visit  Medication Sig Dispense Refill   albuterol  (VENTOLIN  HFA) 108 (90 Base) MCG/ACT inhaler Inhale 2 puffs into the lungs every 6 (six) hours as needed for wheezing or shortness of breath. (Patient not taking: Reported on 06/02/2023) 8 g 2   APPLE CIDER VINEGAR PO Take 2 tablets by mouth 3 (three) times daily after meals. GUMMIES (Patient not taking: Reported on 06/02/2023)     azithromycin  (ZITHROMAX  Z-PAK) 250 MG tablet Take 2 tablets (500 mg) on  Day 1,  followed by 1 tablet (250 mg) once daily on Days  2 through 5. 6 each 0   BLACK ELDERBERRY PO Take 50 mg by mouth at bedtime. (Patient not taking: Reported on 06/02/2023)     cyanocobalamin  (,VITAMIN B-12,) 1000 MCG/ML injection 1ML q monthly (Patient not taking: Reported on 06/02/2023) 1 mL 6   dicyclomine  (BENTYL ) 10 MG capsule Take 1 capsule (10 mg total) by mouth every 8 (eight) hours as needed (diarrhea, cramping). (Patient not taking: Reported on 06/02/2023) 12 capsule 0   estradiol  (ESTRACE ) 2 MG tablet TAKE 1 TABLET BY MOUTH EVERY DAY 60 tablet 0   ESTROVEN MENOPAUSE & WEIGHT 40-56-300 MG CAPS Take 1 tablet by mouth at bedtime. (Patient not taking: Reported on 06/02/2023)     hydrocortisone  (ANUSOL -HC) 2.5 % rectal cream PLACE 1 APPLICATION RECTALLY 2 (TWO) TIMES DAILY. (Patient not taking: Reported on 06/02/2023) 28.35 g 1   hydrocortisone -pramoxine (PROCTOFOAM -HC) rectal foam Place 1 applicator rectally every 8 (eight) hours as needed for hemorrhoids or anal itching. (Patient not taking: Reported on 06/02/2023) 10 g 2   lactulose  (CHRONULAC ) 10 GM/15ML solution Take 30 mLs (20 g total) by mouth daily as needed for mild constipation. (Patient not taking: Reported on 06/02/2023) 120 mL 0   Levonorgestrel-Ethinyl Estradiol  (AMETHIA) 0.15-0.03 &0.01 MG tablet Take 1 tablet by mouth at bedtime. (Patient not taking: Reported on 06/02/2023) 84 tablet 1   melatonin 5 MG TABS Take 5 mg by mouth at bedtime. (Patient not taking: Reported on 06/02/2023)     ondansetron  (ZOFRAN -ODT) 4 MG disintegrating tablet Take 1 tablet (4 mg total) by mouth every 8 (eight) hours as needed for nausea or vomiting. (Patient not taking: Reported on 06/02/2023) 15 tablet 0   Probiotic Product (PROBIOTIC PO) Take 2 capsules by mouth every evening. WITH ASHWAGANDHA (Patient not taking: Reported on 06/02/2023)     No current facility-administered medications on file prior to visit.    Allergies  Allergen Reactions   Aspirin Other (See Comments)    I don't know what kind of  reaction I had. It was when I was little.     Review of Systems Constitutional: negative for chills, fatigue, fevers and sweats Eyes: negative for irritation, redness and visual disturbance Ears, nose, mouth, throat, and face: negative for hearing loss, nasal congestion, snoring and tinnitus Respiratory: negative for asthma, cough, sputum Cardiovascular: negative for chest pain, dyspnea, exertional chest pressure/discomfort, irregular heart beat, palpitations and syncope Gastrointestinal: negative for abdominal pain, change in bowel habits, nausea and  vomiting Genitourinary: negative for abnormal menstrual periods, genital lesions, sexual problems and vaginal discharge, dysuria and urinary incontinence Integument/breast: negative for breast lump, breast tenderness and nipple discharge Hematologic/lymphatic: negative for bleeding and easy bruising Musculoskeletal:negative for back pain and muscle weakness Neurological: negative for dizziness, headaches, vertigo and weakness Endocrine: negative for diabetic symptoms including polydipsia, polyuria and skin dryness Allergic/Immunologic: negative for hay fever and urticaria      Objective:  Last menstrual period 05/02/2020. There is no height or weight on file to calculate BMI.    General Appearance:    Alert, cooperative, no distress, appears stated age  Head:    Normocephalic, without obvious abnormality, atraumatic  Eyes:    PERRL, conjunctiva/corneas clear, EOM's intact, both eyes  Ears:    Normal external ear canals, both ears  Nose:   Nares normal, septum midline, mucosa normal, no drainage or sinus tenderness  Throat:   Lips, mucosa, and tongue normal; teeth and gums normal  Neck:   Supple, symmetrical, trachea midline, no adenopathy; thyroid : no enlargement/tenderness/nodules; no carotid bruit or JVD  Back:     Symmetric, no curvature, ROM normal, no CVA tenderness  Lungs:     Clear to auscultation bilaterally, respirations  unlabored  Chest Wall:    No tenderness or deformity   Heart:    Regular rate and rhythm, S1 and S2 normal, no murmur, rub or gallop  Breast Exam:    No tenderness, masses, or nipple abnormality  Abdomen:     Soft, non-tender, bowel sounds active all four quadrants, no masses, no organomegaly.    Genitalia:    Pelvic:external genitalia normal, vagina without lesions, discharge, or tenderness, rectovaginal septum  normal. Cervix normal in appearance, no cervical motion tenderness, no adnexal masses or tenderness.  Uterus normal size, shape, mobile, regular contours, nontender.  Rectal:    Normal external sphincter.  No hemorrhoids appreciated. Internal exam not done.   Extremities:   Extremities normal, atraumatic, no cyanosis or edema  Pulses:   2+ and symmetric all extremities  Skin:   Skin color, texture, turgor normal, no rashes or lesions  Lymph nodes:   Cervical, supraclavicular, and axillary nodes normal  Neurologic:   CNII-XII intact, normal strength, sensation and reflexes throughout   .  Labs:  Lab Results  Component Value Date   WBC 17.9 (H) 02/23/2024   HGB 10.1 (L) 02/23/2024   HCT 32.9 (L) 02/23/2024   MCV 71.5 (L) 02/23/2024   PLT 473 (H) 02/23/2024    Lab Results  Component Value Date   CREATININE 0.79 02/23/2024   BUN 10 02/23/2024   NA 136 02/23/2024   K 3.9 02/23/2024   CL 105 02/23/2024   CO2 20 (L) 02/23/2024    Lab Results  Component Value Date   ALT 32 02/23/2024   AST 24 02/23/2024   ALKPHOS 77 02/23/2024   BILITOT 0.6 02/23/2024    Lab Results  Component Value Date   TSH 2.060 06/02/2023     Assessment:   No diagnosis found.   Plan:  Blood tests: Pending. Breast self exam technique reviewed and patient encouraged to perform self-exam monthly. Contraception: status post hysterectomy. Discussed healthy lifestyle modifications. Mammogram {discussed/ordered:14545} Pap smear : Not medically necessary. Flu vaccine: Follow up in 1 year for  annual exam   Damien Parsley, CNM [facility]

## 2024-08-01 NOTE — Patient Instructions (Signed)
 Preventive Care 16-46 Years Old, Female Preventive care refers to lifestyle choices and visits with your health care provider that can promote health and wellness. Preventive care visits are also called wellness exams. What can I expect for my preventive care visit? Counseling Your health care provider may ask you questions about your: Medical history, including: Past medical problems. Family medical history. Pregnancy history. Current health, including: Menstrual cycle. Method of birth control. Emotional well-being. Home life and relationship well-being. Sexual activity and sexual health. Lifestyle, including: Alcohol, nicotine or tobacco, and drug use. Access to firearms. Diet, exercise, and sleep habits. Work and work Astronomer. Sunscreen use. Safety issues such as seatbelt and bike helmet use. Physical exam Your health care provider will check your: Height and weight. These may be used to calculate your BMI (body mass index). BMI is a measurement that tells if you are at a healthy weight. Waist circumference. This measures the distance around your waistline. This measurement also tells if you are at a healthy weight and may help predict your risk of certain diseases, such as type 2 diabetes and high blood pressure. Heart rate and blood pressure. Body temperature. Skin for abnormal spots. What immunizations do I need?  Vaccines are usually given at various ages, according to a schedule. Your health care provider will recommend vaccines for you based on your age, medical history, and lifestyle or other factors, such as travel or where you work. What tests do I need? Screening Your health care provider may recommend screening tests for certain conditions. This may include: Lipid and cholesterol levels. Diabetes screening. This is done by checking your blood sugar (glucose) after you have not eaten for a while (fasting). Pelvic exam and Pap test. Hepatitis B test. Hepatitis C  test. HIV (human immunodeficiency virus) test. STI (sexually transmitted infection) testing, if you are at risk. Lung cancer screening. Colorectal cancer screening. Mammogram. Talk with your health care provider about when you should start having regular mammograms. This may depend on whether you have a family history of breast cancer. BRCA-related cancer screening. This may be done if you have a family history of breast, ovarian, tubal, or peritoneal cancers. Bone density scan. This is done to screen for osteoporosis. Talk with your health care provider about your test results, treatment options, and if necessary, the need for more tests. Follow these instructions at home: Eating and drinking  Eat a diet that includes fresh fruits and vegetables, whole grains, lean protein, and low-fat dairy products. Take vitamin and mineral supplements as recommended by your health care provider. Do not drink alcohol if: Your health care provider tells you not to drink. You are pregnant, may be pregnant, or are planning to become pregnant. If you drink alcohol: Limit how much you have to 0-1 drink a day. Know how much alcohol is in your drink. In the [place] one drink equals one 12 oz bottle of beer (355 mL), one 5 oz glass of wine (148 mL), or one 1 oz glass of hard liquor (44 mL). Lifestyle Brush your teeth every morning and night with fluoride toothpaste. Floss one time each day. Exercise for at least 30 minutes 5 or more days each week. Do not use any products that contain nicotine or tobacco. These products include cigarettes, chewing tobacco, and vaping devices, such as e-cigarettes. If you need help quitting, ask your health care provider. Do not use drugs. If you are sexually active, practice safe sex. Use a condom or other form of protection to  prevent STIs. If you do not wish to become pregnant, use a form of birth control. If you plan to become pregnant, see your health care provider for a  prepregnancy visit. Take aspirin only as told by your health care provider. Make sure that you understand how much to take and what form to take. Work with your health care provider to find out whether it is safe and beneficial for you to take aspirin daily. Find healthy ways to manage stress, such as: Meditation, yoga, or listening to music. Journaling. Talking to a trusted person. Spending time with friends and family. Minimize exposure to UV radiation to reduce your risk of skin cancer. Safety Always wear your seat belt while driving or riding in a vehicle. Do not drive: If you have been drinking alcohol. Do not ride with someone who has been drinking. When you are tired or distracted. While texting. If you have been using any mind-altering substances or drugs. Wear a helmet and other protective equipment during sports activities. If you have firearms in your house, make sure you follow all gun safety procedures. Seek help if you have been physically or sexually abused. What's next? Visit your health care provider once a year for an annual wellness visit. Ask your health care provider how often you should have your eyes and teeth checked. Stay up to date on all vaccines. This information is not intended to replace advice given to you by your health care provider. Make sure you discuss any questions you have with your health care provider. How to Do a Breast Self-Exam Doing breast self-exams can help you stay healthy. They're one way to know what's normal for your breasts. They can help you catch a problem while it's still small and can be treated. You need to: Check your breasts often. Tell your doctor about any changes. You should do breast self-exams even if you have breast implants. What you need: A mirror. A well-lit room. A pillow or other soft object. How to do a breast self-exam Look for changes  Take off all the clothes above your waist. Stand in front of a mirror in a  room with good lighting. Put your hands down at your sides. Compare your breasts in the mirror. Look for difference between them, such as: Differences in shape. Differences in size. Wrinkles, dips, and bumps in one breast and not the other. Look at each breast for skin changes, such as: Redness. Scaly spots. Spots where your skin is thicker. Dimpling. Open sores. Look for changes in your nipples, such as: Fluid coming out of a nipple. Fluid around a nipple. Bleeding. Dimpling. Redness. A nipple that looks pushed in or that has changed position. Feel for changes Lie on your back. Feel each breast. To do this: Pick a breast to feel. Place a pillow under the shoulder closest to that breast. Put the arm closest to that breast behind your head. Feel the breast using the hand of your other arm. Use the pads of your three middle fingers to make small circles starting near the nipple. Use light, medium, and firm pressure. Keep making circles, moving down over the breast. Stop when you feel your ribs. Start making circles with your fingers again, this time going up until you reach your collarbone. Then, make circles out across your breast and into your armpit area. Squeeze your nipple. Check for fluid and lumps. Do these steps again to check your other breast. Sit or stand in the tub or shower.  With soapy water on your skin, feel each breast the same way you did when you were lying down. Write down what you find Writing down what you find can help you keep track of what you want to tell your doctor. Write down: What's normal for each breast. Any changes you find. Write down: The kind of change. If your breast feels tender or painful. Any lump you find. Write down its size and where it is. When you last had your period. General tips If you're breastfeeding, the best time to check your breasts is after you feed your baby or after you use a breast pump. If you get a period, the best  time to check your breasts is 5-7 days after your period ends. With time, you'll get more used to doing the self-exam. You'll also start to know if there are changes in your breasts. Contact a doctor if: You see a change in the shape or size of your breasts or nipples. You see a change in the skin of your breast or nipples. You have fluid coming from your nipples that isn't normal. You find a new lump or thick area. You have breast pain. You have any concerns about your breast health. This information is not intended to replace advice given to you by your health care provider. Make sure you discuss any questions you have with your health care provider. Document Revised: 01/13/2024 Document Reviewed: 01/13/2024 [employer] Patient Education  2025 [employer] Document Revised: 05/01/2021 Document Reviewed: 05/01/2021 [employer] Patient Education  2024 [employer].

## 2024-08-02 ENCOUNTER — Encounter: Payer: Self-pay | Admitting: Certified Nurse Midwife

## 2024-08-02 ENCOUNTER — Ambulatory Visit: Admitting: Obstetrics and Gynecology

## 2024-08-02 ENCOUNTER — Ambulatory Visit (INDEPENDENT_AMBULATORY_CARE_PROVIDER_SITE_OTHER): Payer: Self-pay | Admitting: Certified Nurse Midwife

## 2024-08-02 VITALS — BP 108/88 | HR 81 | Resp 16 | Ht 67.0 in | Wt 216.0 lb

## 2024-08-02 DIAGNOSIS — E66811 Obesity, class 1: Secondary | ICD-10-CM

## 2024-08-02 DIAGNOSIS — Z6833 Body mass index (BMI) 33.0-33.9, adult: Secondary | ICD-10-CM

## 2024-08-02 DIAGNOSIS — E669 Obesity, unspecified: Secondary | ICD-10-CM

## 2024-08-02 DIAGNOSIS — Z131 Encounter for screening for diabetes mellitus: Secondary | ICD-10-CM

## 2024-08-02 DIAGNOSIS — R5383 Other fatigue: Secondary | ICD-10-CM

## 2024-08-02 DIAGNOSIS — Z01419 Encounter for gynecological examination (general) (routine) without abnormal findings: Secondary | ICD-10-CM

## 2024-08-03 LAB — TSH: TSH: 1.53 u[IU]/mL (ref 0.450–4.500)

## 2024-08-03 LAB — HEMOGLOBIN A1C
Est. average glucose Bld gHb Est-mCnc: 105 mg/dL
Hgb A1c MFr Bld: 5.3 % (ref 4.8–5.6)

## 2024-08-04 LAB — SPECIMEN STATUS REPORT

## 2024-08-04 LAB — VITAMIN B12: Vitamin B-12: 762 pg/mL (ref 232–1245)

## 2024-08-08 ENCOUNTER — Other Ambulatory Visit: Payer: Self-pay | Admitting: Certified Nurse Midwife

## 2024-08-09 ENCOUNTER — Other Ambulatory Visit: Payer: Self-pay | Admitting: Obstetrics and Gynecology

## 2024-08-09 DIAGNOSIS — N951 Menopausal and female climacteric states: Secondary | ICD-10-CM
